# Patient Record
Sex: Male | Born: 1950 | Race: Black or African American | Hispanic: No | Marital: Married | Smoking: Never smoker
Health system: Southern US, Community
[De-identification: ages and names within clinical notes are randomized; demographics above are authoritative.]

## PROBLEM LIST (undated history)

## (undated) DIAGNOSIS — K4090 Unilateral inguinal hernia, without obstruction or gangrene, not specified as recurrent: Secondary | ICD-10-CM

---
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
## 2007-09-14 ENCOUNTER — Emergency Department (HOSPITAL_COMMUNITY): Admission: EM | Admit: 2007-09-14 | Discharge: 2007-09-14 | Payer: Self-pay | Admitting: Family Medicine

## 2013-04-14 ENCOUNTER — Ambulatory Visit: Payer: Self-pay

## 2018-10-18 ENCOUNTER — Emergency Department (HOSPITAL_COMMUNITY): Payer: No Typology Code available for payment source

## 2018-10-18 ENCOUNTER — Emergency Department (HOSPITAL_COMMUNITY)
Admission: EM | Admit: 2018-10-18 | Discharge: 2018-10-18 | Disposition: A | Discharge status: Home / Self Care | Payer: No Typology Code available for payment source | Attending: Emergency Medicine | Admitting: Emergency Medicine

## 2018-10-18 ENCOUNTER — Encounter (HOSPITAL_COMMUNITY): Payer: Self-pay | Admitting: Emergency Medicine

## 2018-10-18 ENCOUNTER — Other Ambulatory Visit: Payer: Self-pay

## 2018-10-18 DIAGNOSIS — M7918 Myalgia, other site: Secondary | ICD-10-CM | POA: Diagnosis not present

## 2018-10-18 DIAGNOSIS — R911 Solitary pulmonary nodule: Secondary | ICD-10-CM | POA: Diagnosis not present

## 2018-10-18 DIAGNOSIS — K4020 Bilateral inguinal hernia, without obstruction or gangrene, not specified as recurrent: Secondary | ICD-10-CM

## 2018-10-18 DIAGNOSIS — M542 Cervicalgia: Secondary | ICD-10-CM | POA: Insufficient documentation

## 2018-10-18 DIAGNOSIS — R1030 Lower abdominal pain, unspecified: Secondary | ICD-10-CM | POA: Diagnosis present

## 2018-10-18 LAB — CBC WITH DIFFERENTIAL/PLATELET
Abs Immature Granulocytes: 0.01 10*3/uL (ref 0.00–0.07)
Basophils Absolute: 0.1 10*3/uL (ref 0.0–0.1)
Basophils Relative: 1 %
Eosinophils Absolute: 0.1 10*3/uL (ref 0.0–0.5)
Eosinophils Relative: 3 %
HCT: 48 % (ref 39.0–52.0)
Hemoglobin: 15.9 g/dL (ref 13.0–17.0)
Immature Granulocytes: 0 %
Lymphocytes Relative: 35 %
Lymphs Abs: 1.4 10*3/uL (ref 0.7–4.0)
MCH: 31 pg (ref 26.0–34.0)
MCHC: 33.1 g/dL (ref 30.0–36.0)
MCV: 93.6 fL (ref 80.0–100.0)
Monocytes Absolute: 0.5 10*3/uL (ref 0.1–1.0)
Monocytes Relative: 14 %
Neutro Abs: 1.8 10*3/uL (ref 1.7–7.7)
Neutrophils Relative %: 47 %
Platelets: 218 10*3/uL (ref 150–400)
RBC: 5.13 MIL/uL (ref 4.22–5.81)
RDW: 13.4 % (ref 11.5–15.5)
WBC: 3.8 10*3/uL — ABNORMAL LOW (ref 4.0–10.5)
nRBC: 0 % (ref 0.0–0.2)

## 2018-10-18 LAB — URINALYSIS, ROUTINE W REFLEX MICROSCOPIC
Bilirubin Urine: NEGATIVE
Glucose, UA: NEGATIVE mg/dL
Hgb urine dipstick: NEGATIVE
Ketones, ur: 5 mg/dL — AB
Leukocytes,Ua: NEGATIVE
Nitrite: NEGATIVE
Protein, ur: NEGATIVE mg/dL
Specific Gravity, Urine: 1.025 (ref 1.005–1.030)
pH: 6 (ref 5.0–8.0)

## 2018-10-18 LAB — BASIC METABOLIC PANEL
Anion gap: 8 (ref 5–15)
BUN: 12 mg/dL (ref 8–23)
CO2: 25 mmol/L (ref 22–32)
Calcium: 9.2 mg/dL (ref 8.9–10.3)
Chloride: 107 mmol/L (ref 98–111)
Creatinine, Ser: 1.29 mg/dL — ABNORMAL HIGH (ref 0.61–1.24)
GFR calc Af Amer: >60 mL/min (ref >60)
GFR calc non Af Amer: 57 mL/min — ABNORMAL LOW (ref >60)
Glucose, Bld: 92 mg/dL (ref 70–99)
Potassium: 3.8 mmol/L (ref 3.5–5.1)
Sodium: 140 mmol/L (ref 135–145)

## 2018-10-18 MED ORDER — IBUPROFEN 400 MG PO TABS
600.0000 mg | ORAL_TABLET | Freq: Once | ORAL | Status: AC
  Administered 2018-10-18: 600 mg via ORAL
  Filled 2018-10-18: qty 1

## 2018-10-18 MED ORDER — METHOCARBAMOL 500 MG PO TABS: 500.0000 mg | ORAL_TABLET | Freq: Two times a day (BID) | ORAL | 0 refills | Status: DC | PRN

## 2018-10-18 MED ORDER — IOHEXOL 300 MG/ML  SOLN
100.0000 mL | Freq: Once | INTRAMUSCULAR | Status: AC | PRN
  Administered 2018-10-18: 100 mL via INTRAVENOUS

## 2018-10-18 NOTE — Discharge Instructions (Addendum)
Please read instructions below. Apply ice to your areas of pain for 20 minutes at a time. You can take 600 mg of Advil/ibuprofen every 6 hours as needed for pain. You can take flexeril every 12 hours as needed for muscle spasm. Be aware this medication can make you drowsy. Schedule an appointment with your primary care provider to follow up on your visit today. Return to the ER for severely worsening headache, vision changes, if new numbness or tingling in your arms or legs, inability to urinate, inability to hold your bowels, or weakness in your extremities.
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
## 2018-10-18 NOTE — ED Notes (Signed)
Pt speaking with wife, and care explained to wife, and pt regarding care.
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
## 2018-10-18 NOTE — ED Notes (Signed)
Message given to pts wife  That he was waiting on lab results
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
## 2018-10-18 NOTE — ED Notes (Signed)
Feeling better.

## 2018-11-12 ENCOUNTER — Other Ambulatory Visit: Payer: Self-pay | Admitting: General Surgery

## 2018-11-22 ENCOUNTER — Other Ambulatory Visit: Payer: Self-pay | Admitting: General Surgery

## 2018-11-23 NOTE — Patient Instructions (Addendum)
YOU NEED TO HAVE A COVID 19 TEST ON   Friday 8/28_______ @_8 :00______, THIS TEST MUST BE DONE BEFORE SURGERY, COME  [place], [place] , [id number]. ONCE YOUR COVID TEST IS COMPLETED, PLEASE BEGIN THE QUARANTINE INSTRUCTIONS AS OUTLINED IN YOUR HANDOUT.                Ralph Franklin    Your procedure is scheduled on: Tuesday 12/01/18   Report to [facility] Main  Entrance  Report to admitting at 7:30 AM   1 VISITOR IS ALLOWED TO WAIT IN WAITING ROOM  ONLY DAY OF YOUR SURGERY.  NO VISITORS ARE ALLOWED IN SHORT STAY OR RECOVERY ROOM.   Call this number if you have problems the morning of surgery [phone]    Remember: Do not eat food or drink liquids :After Midnight . BRUSH YOUR TEETH MORNING OF SURGERY AND RINSE YOUR MOUTH OUT, NO CHEWING GUM CANDY OR MINTS.     Take these medicines the morning of surgery with A SIP OF WATER: none                                 You may not have any metal on your body including piercings               Do not wear jewelry,  lotions, powders or deodorant                         Men may shave face and neck.   Do not bring valuables to the hospital. [place].  Contacts, dentures or bridgework may not be worn into surgery.       Patients discharged the day of surgery will not be allowed to drive home.  IF YOU ARE HAVING SURGERY AND GOING HOME THE SAME DAY, YOU MUST HAVE AN ADULT TO DRIVE YOU HOME AND BE WITH YOU FOR 24 HOURS.  YOU MAY GO HOME BY TAXI OR UBER OR ORTHERWISE, BUT AN ADULT MUST ACCOMPANY YOU HOME AND STAY WITH YOU FOR 24 HOURS.  Name and phone number of your driver:  Special Instructions: N/A                           [place] - Preparing for Surgery  Before surgery, you can play an important role.   Because skin is not sterile, your skin needs to be as free of germs as possible.   You can reduce the number of germs on your skin by washing with CHG  (chlorahexidine gluconate) soap before surgery.   CHG is an antiseptic cleaner which kills germs and bonds with the skin to continue killing germs even after washing. Please DO NOT use if you have an allergy to CHG or antibacterial soaps.   If your skin becomes reddened/irritated stop using the CHG and inform your nurse when you arrive at Short Stay. You may shave your face/neck. Please follow these instructions carefully:  1.  Shower with CHG Soap the night before surgery and the  morning of Surgery.  2.  If you choose to wash your hair, wash your hair first as usual with your  normal  shampoo.  3.  After you shampoo, rinse your hair and body  thoroughly to remove the  shampoo.                                         4.  Use CHG as you would any other liquid soap.  You can apply chg directly  to the skin and wash                       Gently with a scrungie or clean washcloth.  5.  Apply the CHG Soap to your body ONLY FROM THE NECK DOWN.   Do not use on face/ open                           Wound or open sores. Avoid contact with eyes, ears mouth and genitals (private parts).                       Wash face,  Genitals (private parts) with your normal soap.             6.  Wash thoroughly, paying special attention to the area where your surgery  will be performed.  7.  Thoroughly rinse your body with warm water from the neck down.  8.  DO NOT shower/wash with your normal soap after using and rinsing off  the CHG Soap.             9.  Pat yourself dry with a clean towel.            10.  Wear clean pajamas.            11.  Place clean sheets on your bed the night of your first shower and do not  sleep with pets. Day of Surgery : Do not apply any lotions/deodorants the morning of surgery.  Please wear clean clothes to the hospital/surgery center.  FAILURE TO FOLLOW THESE INSTRUCTIONS MAY RESULT IN THE CANCELLATION OF YOUR SURGERY PATIENT SIGNATURE_________________________________  NURSE  SIGNATURE__________________________________  ________________________________________________________________________

## 2018-11-24 ENCOUNTER — Encounter (HOSPITAL_COMMUNITY)
Admission: RE | Admit: 2018-11-24 | Discharge: 2018-11-24 | Disposition: A | Discharge status: Home / Self Care | Payer: No Typology Code available for payment source | Source: Ambulatory Visit | Attending: General Surgery | Admitting: General Surgery

## 2018-11-24 ENCOUNTER — Other Ambulatory Visit: Payer: Self-pay

## 2018-11-24 ENCOUNTER — Encounter (HOSPITAL_COMMUNITY): Payer: Self-pay

## 2018-11-24 DIAGNOSIS — K4020 Bilateral inguinal hernia, without obstruction or gangrene, not specified as recurrent: Secondary | ICD-10-CM | POA: Diagnosis not present

## 2018-11-24 DIAGNOSIS — R9431 Abnormal electrocardiogram [ECG] [EKG]: Secondary | ICD-10-CM | POA: Diagnosis not present

## 2018-11-24 DIAGNOSIS — Z01818 Encounter for other preprocedural examination: Secondary | ICD-10-CM | POA: Insufficient documentation

## 2018-11-24 LAB — COMPREHENSIVE METABOLIC PANEL
ALT: 15 U/L (ref 0–44)
AST: 13 U/L — ABNORMAL LOW (ref 15–41)
Albumin: 4.1 g/dL (ref 3.5–5.0)
Alkaline Phosphatase: 46 U/L (ref 38–126)
Anion gap: 7 (ref 5–15)
BUN: 15 mg/dL (ref 8–23)
CO2: 29 mmol/L (ref 22–32)
Calcium: 9 mg/dL (ref 8.9–10.3)
Chloride: 105 mmol/L (ref 98–111)
Creatinine, Ser: 1.15 mg/dL (ref 0.61–1.24)
GFR calc Af Amer: >60 mL/min (ref >60)
GFR calc non Af Amer: >60 mL/min (ref >60)
Glucose, Bld: 87 mg/dL (ref 70–99)
Potassium: 3.9 mmol/L (ref 3.5–5.1)
Sodium: 141 mmol/L (ref 135–145)
Total Bilirubin: 0.9 mg/dL (ref 0.3–1.2)
Total Protein: 6.9 g/dL (ref 6.5–8.1)

## 2018-11-24 LAB — CBC WITH DIFFERENTIAL/PLATELET
Abs Immature Granulocytes: 0.01 10*3/uL (ref 0.00–0.07)
Basophils Absolute: 0 10*3/uL (ref 0.0–0.1)
Basophils Relative: 1 %
Eosinophils Absolute: 0.2 10*3/uL (ref 0.0–0.5)
Eosinophils Relative: 5 %
HCT: 47.3 % (ref 39.0–52.0)
Hemoglobin: 15.5 g/dL (ref 13.0–17.0)
Immature Granulocytes: 0 %
Lymphocytes Relative: 26 %
Lymphs Abs: 1 10*3/uL (ref 0.7–4.0)
MCH: 31.2 pg (ref 26.0–34.0)
MCHC: 32.8 g/dL (ref 30.0–36.0)
MCV: 95.2 fL (ref 80.0–100.0)
Monocytes Absolute: 0.6 10*3/uL (ref 0.1–1.0)
Monocytes Relative: 15 %
Neutro Abs: 2.1 10*3/uL (ref 1.7–7.7)
Neutrophils Relative %: 53 %
Platelets: 197 10*3/uL (ref 150–400)
RBC: 4.97 MIL/uL (ref 4.22–5.81)
RDW: 13.6 % (ref 11.5–15.5)
WBC: 4 10*3/uL (ref 4.0–10.5)
nRBC: 0 % (ref 0.0–0.2)

## 2018-11-24 NOTE — Progress Notes (Signed)
PCP - Dr. Milas Gain Cardiologist -   Chest x-ray -  EKG -on chart  Stress Test -  ECHO -  Cardiac Cath -   Sleep Study - no CPAP -   Fasting Blood Sugar - na Checks Blood Sugar _____ times a day  Blood Thinner Instructions:an Aspirin Instructions: Last Dose:  Anesthesia review:   Patient denies shortness of breath, fever, cough and chest pain at PAT appointment yes   Patient verbalized understanding of instructions that were given to them at the PAT appointment. Patient was also instructed that they will need to review over the PAT instructions again at home before surgery. yes

## 2018-11-27 ENCOUNTER — Other Ambulatory Visit (HOSPITAL_COMMUNITY)
Admission: RE | Admit: 2018-11-27 | Discharge: 2018-11-27 | Disposition: A | Discharge status: Home / Self Care | Payer: Medicare Other | Source: Ambulatory Visit | Attending: General Surgery | Admitting: General Surgery

## 2018-11-27 DIAGNOSIS — Z01812 Encounter for preprocedural laboratory examination: Secondary | ICD-10-CM | POA: Insufficient documentation

## 2018-11-27 DIAGNOSIS — Z20828 Contact with and (suspected) exposure to other viral communicable diseases: Secondary | ICD-10-CM | POA: Insufficient documentation

## 2018-11-27 LAB — SARS CORONAVIRUS 2 (TAT 6-24 HRS): SARS Coronavirus 2: NEGATIVE
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
## 2018-12-01 ENCOUNTER — Ambulatory Visit (HOSPITAL_COMMUNITY): Payer: No Typology Code available for payment source | Admitting: Physician Assistant

## 2018-12-01 ENCOUNTER — Encounter (HOSPITAL_COMMUNITY): Payer: Self-pay | Admitting: *Deleted

## 2018-12-01 ENCOUNTER — Ambulatory Visit (HOSPITAL_COMMUNITY): Payer: No Typology Code available for payment source | Admitting: Anesthesiology

## 2018-12-01 ENCOUNTER — Ambulatory Visit (HOSPITAL_COMMUNITY)
Admission: RE | Admit: 2018-12-01 | Discharge: 2018-12-01 | Disposition: A | Discharge status: Home / Self Care | Payer: No Typology Code available for payment source | Source: Ambulatory Visit | Attending: General Surgery | Admitting: General Surgery

## 2018-12-01 ENCOUNTER — Encounter (HOSPITAL_COMMUNITY)
Admission: RE | Disposition: A | Discharge status: Home / Self Care | Payer: Self-pay | Source: Ambulatory Visit | Attending: General Surgery

## 2018-12-01 DIAGNOSIS — K4090 Unilateral inguinal hernia, without obstruction or gangrene, not specified as recurrent: Secondary | ICD-10-CM

## 2018-12-01 DIAGNOSIS — K4020 Bilateral inguinal hernia, without obstruction or gangrene, not specified as recurrent: Secondary | ICD-10-CM | POA: Diagnosis not present

## 2018-12-01 DIAGNOSIS — D176 Benign lipomatous neoplasm of spermatic cord: Secondary | ICD-10-CM | POA: Diagnosis not present

## 2018-12-01 HISTORY — PX: INGUINAL HERNIA REPAIR: SHX194

## 2018-12-01 HISTORY — DX: Unilateral inguinal hernia, without obstruction or gangrene, not specified as recurrent: K40.90

## 2018-12-01 SURGERY — REPAIR, HERNIA, INGUINAL, BILATERAL, LAPAROSCOPIC
Anesthesia: General | Site: Abdomen | Laterality: Bilateral

## 2018-12-01 MED ORDER — EPHEDRINE SULFATE-NACL 50-0.9 MG/10ML-% IV SOSY
PREFILLED_SYRINGE | INTRAVENOUS | Status: DC | PRN
  Administered 2018-12-01: 5 mg via INTRAVENOUS

## 2018-12-01 MED ORDER — CHLORHEXIDINE GLUCONATE CLOTH 2 % EX PADS: 6.0000 | MEDICATED_PAD | Freq: Once | CUTANEOUS | Status: DC

## 2018-12-01 MED ORDER — DEXAMETHASONE SODIUM PHOSPHATE 10 MG/ML IJ SOLN
INTRAMUSCULAR | Status: DC | PRN
  Administered 2018-12-01: 10 mg via INTRAVENOUS

## 2018-12-01 MED ORDER — ONDANSETRON HCL 4 MG/2ML IJ SOLN
4.0000 mg | Freq: Once | INTRAMUSCULAR | Status: AC | PRN
  Administered 2018-12-01: 4 mg via INTRAVENOUS

## 2018-12-01 MED ORDER — OXYCODONE HCL 5 MG/5ML PO SOLN: 5.0000 mg | Freq: Once | ORAL | Status: DC | PRN

## 2018-12-01 MED ORDER — OXYCODONE HCL 5 MG PO TABS: 5.0000 mg | ORAL_TABLET | Freq: Once | ORAL | Status: DC | PRN

## 2018-12-01 MED ORDER — MIDAZOLAM HCL 5 MG/5ML IJ SOLN
INTRAMUSCULAR | Status: DC | PRN
  Administered 2018-12-01: 2 mg via INTRAVENOUS

## 2018-12-01 MED ORDER — PHENYLEPHRINE 40 MCG/ML (10ML) SYRINGE FOR IV PUSH (FOR BLOOD PRESSURE SUPPORT)
PREFILLED_SYRINGE | INTRAVENOUS | Status: AC
  Filled 2018-12-01: qty 10

## 2018-12-01 MED ORDER — CEFAZOLIN SODIUM-DEXTROSE 2-4 GM/100ML-% IV SOLN
2.0000 g | INTRAVENOUS | Status: AC
  Administered 2018-12-01: 10:00:00 2 g via INTRAVENOUS
  Filled 2018-12-01: qty 100

## 2018-12-01 MED ORDER — ONDANSETRON HCL 4 MG/2ML IJ SOLN
INTRAMUSCULAR | Status: DC | PRN
  Administered 2018-12-01: 4 mg via INTRAVENOUS

## 2018-12-01 MED ORDER — ROCURONIUM BROMIDE 10 MG/ML (PF) SYRINGE
PREFILLED_SYRINGE | INTRAVENOUS | Status: AC
  Filled 2018-12-01: qty 10

## 2018-12-01 MED ORDER — DEXAMETHASONE SODIUM PHOSPHATE 10 MG/ML IJ SOLN
INTRAMUSCULAR | Status: AC
  Filled 2018-12-01: qty 1

## 2018-12-01 MED ORDER — ACETAMINOPHEN 500 MG PO TABS
1000.0000 mg | ORAL_TABLET | ORAL | Status: AC
  Administered 2018-12-01: 1000 mg via ORAL
  Filled 2018-12-01: qty 2

## 2018-12-01 MED ORDER — CELECOXIB 200 MG PO CAPS
200.0000 mg | ORAL_CAPSULE | ORAL | Status: AC
  Administered 2018-12-01: 200 mg via ORAL
  Filled 2018-12-01: qty 1

## 2018-12-01 MED ORDER — HYDROCODONE-ACETAMINOPHEN 5-325 MG PO TABS: 1.0000 | ORAL_TABLET | Freq: Four times a day (QID) | ORAL | 0 refills | Status: DC | PRN

## 2018-12-01 MED ORDER — SUGAMMADEX SODIUM 200 MG/2ML IV SOLN
INTRAVENOUS | Status: DC | PRN
  Administered 2018-12-01: 200 mg via INTRAVENOUS

## 2018-12-01 MED ORDER — FENTANYL CITRATE (PF) 100 MCG/2ML IJ SOLN
25.0000 ug | INTRAMUSCULAR | Status: DC | PRN
  Administered 2018-12-01 (×2): 50 ug via INTRAVENOUS

## 2018-12-01 MED ORDER — GABAPENTIN 300 MG PO CAPS
300.0000 mg | ORAL_CAPSULE | ORAL | Status: AC
  Administered 2018-12-01: 08:00:00 300 mg via ORAL
  Filled 2018-12-01: qty 1

## 2018-12-01 MED ORDER — MIDAZOLAM HCL 2 MG/2ML IJ SOLN
INTRAMUSCULAR | Status: AC
  Filled 2018-12-01: qty 2

## 2018-12-01 MED ORDER — LIDOCAINE 2% (20 MG/ML) 5 ML SYRINGE
INTRAMUSCULAR | Status: DC | PRN
  Administered 2018-12-01: 80 mg via INTRAVENOUS

## 2018-12-01 MED ORDER — FENTANYL CITRATE (PF) 250 MCG/5ML IJ SOLN
INTRAMUSCULAR | Status: AC
  Filled 2018-12-01: qty 5

## 2018-12-01 MED ORDER — FENTANYL CITRATE (PF) 100 MCG/2ML IJ SOLN
INTRAMUSCULAR | Status: DC | PRN
  Administered 2018-12-01: 100 ug via INTRAVENOUS
  Administered 2018-12-01: 50 ug via INTRAVENOUS

## 2018-12-01 MED ORDER — BUPIVACAINE-EPINEPHRINE 0.5% -1:200000 IJ SOLN
INTRAMUSCULAR | Status: AC
  Filled 2018-12-01: qty 1

## 2018-12-01 MED ORDER — EPHEDRINE 5 MG/ML INJ
INTRAVENOUS | Status: AC
  Filled 2018-12-01: qty 10

## 2018-12-01 MED ORDER — LACTATED RINGERS IV SOLN
INTRAVENOUS | Status: DC
  Administered 2018-12-01 (×2): via INTRAVENOUS

## 2018-12-01 MED ORDER — 0.9 % SODIUM CHLORIDE (POUR BTL) OPTIME
TOPICAL | Status: DC | PRN
  Administered 2018-12-01: 10:00:00 1000 mL

## 2018-12-01 MED ORDER — SODIUM CHLORIDE 0.9% FLUSH: 3.0000 mL | Freq: Two times a day (BID) | INTRAVENOUS | Status: DC

## 2018-12-01 MED ORDER — BUPIVACAINE-EPINEPHRINE 0.5% -1:200000 IJ SOLN
INTRAMUSCULAR | Status: DC | PRN
  Administered 2018-12-01: 20 mL

## 2018-12-01 MED ORDER — PHENYLEPHRINE 40 MCG/ML (10ML) SYRINGE FOR IV PUSH (FOR BLOOD PRESSURE SUPPORT)
PREFILLED_SYRINGE | INTRAVENOUS | Status: DC | PRN
  Administered 2018-12-01: 40 ug via INTRAVENOUS
  Administered 2018-12-01: 120 ug via INTRAVENOUS

## 2018-12-01 MED ORDER — FENTANYL CITRATE (PF) 100 MCG/2ML IJ SOLN
INTRAMUSCULAR | Status: AC
  Filled 2018-12-01: qty 2

## 2018-12-01 MED ORDER — ONDANSETRON HCL 4 MG/2ML IJ SOLN
INTRAMUSCULAR | Status: AC
  Filled 2018-12-01: qty 2

## 2018-12-01 MED ORDER — ROCURONIUM BROMIDE 50 MG/5ML IV SOSY
PREFILLED_SYRINGE | INTRAVENOUS | Status: DC | PRN
  Administered 2018-12-01: 10 mg via INTRAVENOUS
  Administered 2018-12-01: 50 mg via INTRAVENOUS
  Administered 2018-12-01: 10 mg via INTRAVENOUS

## 2018-12-01 MED ORDER — LIDOCAINE 2% (20 MG/ML) 5 ML SYRINGE
INTRAMUSCULAR | Status: AC
  Filled 2018-12-01: qty 5

## 2018-12-01 MED ORDER — PROPOFOL 10 MG/ML IV BOLUS
INTRAVENOUS | Status: DC | PRN
  Administered 2018-12-01: 180 mg via INTRAVENOUS

## 2018-12-01 MED ORDER — SUCCINYLCHOLINE CHLORIDE 200 MG/10ML IV SOSY
PREFILLED_SYRINGE | INTRAVENOUS | Status: AC
  Filled 2018-12-01: qty 10

## 2018-12-01 SURGICAL SUPPLY — 39 items
APPLIER CLIP 5 13 M/L LIGAMAX5 (MISCELLANEOUS)
BENZOIN TINCTURE PRP APPL 2/3 (GAUZE/BANDAGES/DRESSINGS) IMPLANT
CABLE HIGH FREQUENCY MONO STRZ (ELECTRODE) ×3 IMPLANT
CHLORAPREP W/TINT 26 (MISCELLANEOUS) ×3 IMPLANT
CLIP APPLIE 5 13 M/L LIGAMAX5 (MISCELLANEOUS) IMPLANT
CLOSURE WOUND 1/2 X4 (GAUZE/BANDAGES/DRESSINGS)
COVER SURGICAL LIGHT HANDLE (MISCELLANEOUS) ×3 IMPLANT
COVER WAND RF STERILE (DRAPES) IMPLANT
DECANTER SPIKE VIAL GLASS SM (MISCELLANEOUS) ×3 IMPLANT
DERMABOND ADVANCED (GAUZE/BANDAGES/DRESSINGS)
DERMABOND ADVANCED .7 DNX12 (GAUZE/BANDAGES/DRESSINGS) IMPLANT
DEVICE SECURE STRAP 25 ABSORB (INSTRUMENTS) ×3 IMPLANT
DISSECT BALLN SPACEMKR + OVL (BALLOONS) ×3
DISSECTOR BALLN SPACEMKR + OVL (BALLOONS) ×1 IMPLANT
DISSECTOR BLUNT TIP ENDO 5MM (MISCELLANEOUS) IMPLANT
DRAPE WARM FLUID 44X44 (DRAPES) IMPLANT
ELECT REM PT RETURN 15FT ADLT (MISCELLANEOUS) ×3 IMPLANT
GLOVE EUDERMIC 7 POWDERFREE (GLOVE) ×3 IMPLANT
GOWN STRL REUS W/TWL XL LVL3 (GOWN DISPOSABLE) ×9 IMPLANT
HEMOSTAT SNOW SURGICEL 2X4 (HEMOSTASIS) IMPLANT
KIT BASIN OR (CUSTOM PROCEDURE TRAY) ×3 IMPLANT
KIT TURNOVER KIT A (KITS) IMPLANT
MESH 3DMAX LIGHT 4.1X6.2 LT LR (Mesh General) ×3 IMPLANT
MESH 3DMAX LIGHT 4.1X6.2 RT LR (Mesh General) ×3 IMPLANT
PAD POSITIONING PINK XL (MISCELLANEOUS) ×3 IMPLANT
PROTECTOR NERVE ULNAR (MISCELLANEOUS) IMPLANT
SCISSORS LAP 5X35 DISP (ENDOMECHANICALS) ×3 IMPLANT
SET IRRIG TUBING LAPAROSCOPIC (IRRIGATION / IRRIGATOR) IMPLANT
SET TUBE SMOKE EVAC HIGH FLOW (TUBING) ×3 IMPLANT
STOPCOCK 4 WAY LG BORE MALE ST (IV SETS) IMPLANT
STRIP CLOSURE SKIN 1/2X4 (GAUZE/BANDAGES/DRESSINGS) IMPLANT
SUT MNCRL AB 4-0 PS2 18 (SUTURE) ×3 IMPLANT
SUT VIC AB 3-0 SH 27 (SUTURE)
SUT VIC AB 3-0 SH 27XBRD (SUTURE) IMPLANT
TACKER 5MM HERNIA 3.5CML NAB (ENDOMECHANICALS) ×3 IMPLANT
TAPE CLOTH 4X10 WHT NS (GAUZE/BANDAGES/DRESSINGS) IMPLANT
TOWEL OR 17X26 10 PK STRL BLUE (TOWEL DISPOSABLE) ×3 IMPLANT
TRAY LAPAROSCOPIC (CUSTOM PROCEDURE TRAY) ×3 IMPLANT
TROCAR CANNULA W/PORT DUAL 5MM (MISCELLANEOUS) ×6 IMPLANT

## 2018-12-01 NOTE — Transfer of Care (Signed)
Immediate Anesthesia Transfer of Care Note  Patient: Ralph Franklin  Procedure(s) Performed: LAPAROSCOPIC BILATERAL INGUINAL HERNIA REPAIR WITH MESH (Bilateral Abdomen)  Patient Location: PACU  Anesthesia Type:General  Level of Consciousness: awake, alert  and oriented  Airway & Oxygen Therapy: Patient Spontanous Breathing and Patient connected to face mask oxygen  Post-op Assessment: Report given to RN and Post -op Vital signs reviewed and stable  Post vital signs: Reviewed and stable  Last Vitals:  Vitals Value Taken Time  BP 141/98 12/01/18 1141  Temp    Pulse 76 12/01/18 1143  Resp 15 12/01/18 1143  SpO2 100 % 12/01/18 1143  Vitals shown include unvalidated device data.  Last Pain:  Vitals:   12/01/18 0800  TempSrc:   PainSc: 0-No pain         Complications: No apparent anesthesia complications

## 2018-12-01 NOTE — Anesthesia Preprocedure Evaluation (Addendum)
Anesthesia Evaluation  Patient identified by MRN, date of birth, ID band Patient awake    Reviewed: Allergy & Precautions, NPO status , Patient's Chart, lab work & pertinent test results  Airway Mallampati: II  TM Distance: >3 FB Neck ROM: Full    Dental  (+) Teeth Intact, Dental Advisory Given   Pulmonary    breath sounds clear to auscultation       Cardiovascular  Rhythm:Regular Rate:Normal     Neuro/Psych    GI/Hepatic   Endo/Other    Renal/GU      Musculoskeletal   Abdominal   Peds  Hematology   Anesthesia Other Findings   Reproductive/Obstetrics                            Anesthesia Physical Anesthesia Plan  ASA: II  Anesthesia Plan: General   Post-op Pain Management:    Induction: Intravenous  PONV Risk Score and Plan: Ondansetron and Dexamethasone  Airway Management Planned: Oral ETT  Additional Equipment:   Intra-op Plan:   Post-operative Plan: Extubation in OR  Informed Consent: I have reviewed the patients History and Physical, chart, labs and discussed the procedure including the risks, benefits and alternatives for the proposed anesthesia with the patient or authorized representative who has indicated his/her understanding and acceptance.       Plan Discussed with: Anesthesiologist and CRNA  Anesthesia Plan Comments:         Anesthesia Quick Evaluation
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
## 2018-12-01 NOTE — Discharge Instructions (Signed)
[facility] _______[facility]  UMBILICAL OR INGUINAL HERNIA REPAIR: POST OP INSTRUCTIONS  Always review your discharge instruction sheet given to you by the facility where your surgery was performed. IF YOU HAVE DISABILITY OR FAMILY LEAVE FORMS, YOU MUST BRING THEM TO THE OFFICE FOR PROCESSING.   DO NOT GIVE THEM TO YOUR DOCTOR.  1. A  prescription for pain medication may be given to you upon discharge.  Take your pain medication as prescribed, if needed.  If narcotic pain medicine is not needed, then you may take acetaminophen (Tylenol) or ibuprofen (Advil) as needed. 2. Take your usually prescribed medications unless otherwise directed. If you need a refill on your pain medication, please contact your pharmacy.  They will contact our office to request authorization. Prescriptions will not be filled after 5 pm or on week-ends. 3. You should follow a light diet the first 24 hours after arrival home, such as soup and crackers, etc.  Be sure to include lots of fluids daily.  Resume your normal diet the day after surgery. 4.Most patients will experience some swelling and bruising around the umbilicus or in the groin and scrotum.  Ice packs and reclining will help.  Swelling and bruising can take several days to resolve.  6. It is common to experience some constipation if taking pain medication after surgery.  Increasing fluid intake and taking a stool softener (such as Colace) will usually help or prevent this problem from occurring.  A mild laxative (Milk of Magnesia or Miralax) should be taken according to package directions if there are no bowel movements after 48 hours. 7. Unless discharge instructions indicate otherwise, you may remove your bandages 24-48 hours after surgery, and you may shower at that time.  You may have steri-strips (small skin tapes) in place directly over the incision.  These strips should be left on the skin for 7-10 days.  If your surgeon used skin glue on the  incision, you may shower in 24 hours.  The glue will flake off over the next 2-3 weeks.  Any sutures or staples will be removed at the office during your follow-up visit. 8. ACTIVITIES:  You may resume regular (light) daily activities beginning the next day--such as daily self-care, walking, climbing stairs--gradually increasing activities as tolerated.  You may have sexual intercourse when it is comfortable.  Refrain from any heavy lifting or straining until approved by your doctor.  a.You may drive when you are no longer taking prescription pain medication, you can comfortably wear a seatbelt, and you can safely maneuver your car and apply brakes. b.RETURN TO WORK:   _____________________________________________  9.You should see your doctor in the office for a follow-up appointment approximately 2-3 weeks after your surgery.  Make sure that you call for this appointment within a day or two after you arrive home to insure a convenient appointment time. 10.OTHER INSTRUCTIONS: _________________________    _____________________________________  WHEN TO CALL YOUR DOCTOR: 1. Fever over 101.0 2. Inability to urinate 3. Nausea and/or vomiting 4. Extreme swelling or bruising 5. Continued bleeding from incision. 6. Increased pain, redness, or drainage from the incision  The clinic staff is available to answer your questions during regular business hours.  Please don't hesitate to call and ask to speak to one of the nurses for clinical concerns.  If you have a medical emergency, go to the nearest emergency room or call 911.  A surgeon from [facility] is always on call at the hospital     [address], [address], [place], [place]  [address] ?  [address] [address], [place],    [address] [phone] ? [phone] ? FAX [phone] Web site: [email or web address]  .........   Managing Your Pain After Surgery Without Opioids    Thank you for participating in our  program to help patients manage their pain after surgery without opioids. This is part of our effort to provide you with the best care possible, without exposing you or your family to the risk that opioids pose.  What pain can I expect after surgery? You can expect to have some pain after surgery. This is normal. The pain is typically worse the day after surgery, and quickly begins to get better. Many studies have found that many patients are able to manage their pain after surgery with Over-the-Counter (OTC) medications such as Tylenol and Motrin. If you have a condition that does not allow you to take Tylenol or Motrin, notify your surgical team.  How will I manage my pain? The best strategy for controlling your pain after surgery is around the clock pain control with Tylenol (acetaminophen) and Motrin (ibuprofen or Advil). Alternating these medications with each other allows you to maximize your pain control. In addition to Tylenol and Motrin, you can use heating pads or ice packs on your incisions to help reduce your pain.  How will I alternate your regular strength over-the-counter pain medication? You will take a dose of pain medication every three hours. ; Start by taking 650 mg of Tylenol (2 pills of 325 mg) ; 3 hours later take 600 mg of Motrin (3 pills of 200 mg) ; 3 hours after taking the Motrin take 650 mg of Tylenol ; 3 hours after that take 600 mg of Motrin.   - 1 -  See example - if your first dose of Tylenol is at 12:00 PM   12:00 PM Tylenol 650 mg (2 pills of 325 mg)  3:00 PM Motrin 600 mg (3 pills of 200 mg)  6:00 PM Tylenol 650 mg (2 pills of 325 mg)  9:00 PM Motrin 600 mg (3 pills of 200 mg)  Continue alternating every 3 hours   We recommend that you follow this schedule around-the-clock for at least 3 days after surgery, or until you feel that it is no longer needed. Use the table on the last page of this handout to keep track of the medications you are  taking. Important: Do not take more than 3000mg of Tylenol or 3200mg of Motrin in a 24-hour period. Do not take ibuprofen/Motrin if you have a history of bleeding stomach ulcers, severe kidney disease, &/or actively taking a blood thinner  What if I still have pain? If you have pain that is not controlled with the over-the-counter pain medications (Tylenol and Motrin or Advil) you might have what we call "breakthrough" pain. You will receive a prescription for a small amount of an opioid pain medication such as Oxycodone, Tramadol, or Tylenol with Codeine. Use these opioid pills in the first 24 hours after surgery if you have breakthrough pain. Do not take more than 1 pill every 4-6 hours.  If you still have uncontrolled pain after using all opioid pills, don't hesitate to call our staff using the number provided. We will help make sure you are managing your pain in the best way possible, and if necessary, we can provide a prescription for additional pain medication.   Day 1    Time  Name of   Medication Number of pills taken  Amount of Acetaminophen  Pain Level   Comments  AM PM       AM PM       AM PM       AM PM       AM PM       AM PM       AM PM       AM PM       Total Daily amount of Acetaminophen Do not take more than  3,000 mg per day      Day 2    Time  Name of Medication Number of pills taken  Amount of Acetaminophen  Pain Level   Comments  AM PM       AM PM       AM PM       AM PM       AM PM       AM PM       AM PM       AM PM       Total Daily amount of Acetaminophen Do not take more than  3,000 mg per day      Day 3    Time  Name of Medication Number of pills taken  Amount of Acetaminophen  Pain Level   Comments  AM PM       AM PM       AM PM       AM PM          AM PM       AM PM       AM PM       AM PM       Total Daily amount of Acetaminophen Do not take more than  3,000 mg per day      Day 4    Time  Name of Medication  Number of pills taken  Amount of Acetaminophen  Pain Level   Comments  AM PM       AM PM       AM PM       AM PM       AM PM       AM PM       AM PM       AM PM       Total Daily amount of Acetaminophen Do not take more than  3,000 mg per day      Day 5    Time  Name of Medication Number of pills taken  Amount of Acetaminophen  Pain Level   Comments  AM PM       AM PM       AM PM       AM PM       AM PM       AM PM       AM PM       AM PM       Total Daily amount of Acetaminophen Do not take more than  3,000 mg per day       Day 6    Time  Name of Medication Number of pills taken  Amount of Acetaminophen  Pain Level  Comments  AM PM       AM PM       AM PM       AM PM       AM PM         AM PM       AM PM       AM PM       Total Daily amount of Acetaminophen Do not take more than  3,000 mg per day      Day 7    Time  Name of Medication Number of pills taken  Amount of Acetaminophen  Pain Level   Comments  AM PM       AM PM       AM PM       AM PM       AM PM       AM PM       AM PM       AM PM       Total Daily amount of Acetaminophen Do not take more than  3,000 mg per day        For additional information about how and where to safely dispose of unused opioid medications - https://www.morepowerfulnc.org  Disclaimer: This document contains information and/or instructional materials adapted from [facility] for the typical patient with your condition. It does not replace medical advice from your health care provider because your experience may differ from that of the typical patient. Talk to your health care provider if you have any questions about this document, your condition or your treatment plan. Adapted from [facility]   

## 2018-12-01 NOTE — Anesthesia Procedure Notes (Signed)
Procedure Name: Intubation Date/Time: 12/01/2018 9:55 AM Performed by: Maxwell Caul, CRNA Pre-anesthesia Checklist: Patient identified, Emergency Drugs available, Suction available and Patient being monitored Patient Re-evaluated:Patient Re-evaluated prior to induction Oxygen Delivery Method: Circle system utilized Preoxygenation: Pre-oxygenation with 100% oxygen Induction Type: IV induction Ventilation: Mask ventilation without difficulty Laryngoscope Size: Mac and 4 Grade View: Grade I Tube type: Oral Tube size: 7.5 mm Number of attempts: 1 Airway Equipment and Method: Stylet Placement Confirmation: ETT inserted through vocal cords under direct vision,  positive ETCO2 and breath sounds checked- equal and bilateral Secured at: 21 cm Tube secured with: Tape Dental Injury: Teeth and Oropharynx as per pre-operative assessment  Comments: Intubated by EMT.

## 2018-12-01 NOTE — Interval H&P Note (Signed)
History and Physical Interval Note:  12/01/2018 7:28 AM  Ralph Franklin  has presented today for surgery, with the diagnosis of [place].  The various methods of treatment have been discussed with the patient and family. After consideration of risks, benefits and other options for treatment, the patient has consented to  Procedure(s): LAPAROSCOPIC BILATERAL INGUINAL HERNIA REPAIR WITH MESH (Bilateral) as a surgical intervention.  The patient's history has been reviewed, patient examined, no change in status, stable for surgery.  I have reviewed the patient's chart and labs.  Questions were answered to the patient's satisfaction.     Adin Hector

## 2018-12-01 NOTE — Anesthesia Postprocedure Evaluation (Signed)
Anesthesia Post Note  Patient: Ralph Franklin  Procedure(s) Performed: LAPAROSCOPIC BILATERAL INGUINAL HERNIA REPAIR WITH MESH (Bilateral Abdomen)     Patient location during evaluation: PACU Anesthesia Type: General Level of consciousness: awake and alert Pain management: pain level controlled Vital Signs Assessment: post-procedure vital signs reviewed and stable Respiratory status: spontaneous breathing, nonlabored ventilation, respiratory function stable and patient connected to nasal cannula oxygen Cardiovascular status: blood pressure returned to baseline and stable Postop Assessment: no apparent nausea or vomiting Anesthetic complications: no    Last Vitals:  Vitals:   12/01/18 1230 12/01/18 1409  BP: (!) 143/95 (!) 147/96  Pulse: (!) 56 63  Resp: (!) 9 18  Temp:    SpO2: 100% 97%    Last Pain:  Vitals:   12/01/18 1409  TempSrc:   PainSc: 4                  [address] COKER

## 2018-12-03 ENCOUNTER — Encounter (HOSPITAL_COMMUNITY): Payer: Self-pay | Admitting: General Surgery

## 2019-01-20 ENCOUNTER — Other Ambulatory Visit: Payer: Self-pay | Admitting: Neurosurgery

## 2019-01-21 ENCOUNTER — Other Ambulatory Visit: Payer: Self-pay

## 2019-01-21 ENCOUNTER — Other Ambulatory Visit (HOSPITAL_COMMUNITY)
Admission: RE | Admit: 2019-01-21 | Discharge: 2019-01-21 | Disposition: A | Discharge status: Home / Self Care | Payer: Medicare Other | Source: Ambulatory Visit | Attending: Neurosurgery | Admitting: Neurosurgery

## 2019-01-21 DIAGNOSIS — Z01812 Encounter for preprocedural laboratory examination: Secondary | ICD-10-CM | POA: Insufficient documentation

## 2019-01-21 DIAGNOSIS — Z20828 Contact with and (suspected) exposure to other viral communicable diseases: Secondary | ICD-10-CM | POA: Diagnosis not present

## 2019-01-21 NOTE — Progress Notes (Signed)
Spoke with pt for pre-op call. Pt states his name is Ralph Franklin, Ralph Franklin. We have the right birthdate, but the wrong name. The name was correct earlier today, but at some point it was changed.  Pt denies cardiac history, HTN or Diabetes.  Pt had his Covid test done today. He states he is in quarantine now and will continue until day of surgery.  Pt voiced understanding of Visitation policy.

## 2019-01-23 LAB — NOVEL CORONAVIRUS, NAA (HOSP ORDER, SEND-OUT TO REF LAB; TAT 18-24 HRS): SARS-CoV-2, NAA: NOT DETECTED

## 2019-01-25 ENCOUNTER — Inpatient Hospital Stay (HOSPITAL_COMMUNITY): Payer: No Typology Code available for payment source | Admitting: Certified Registered Nurse Anesthetist

## 2019-01-25 ENCOUNTER — Inpatient Hospital Stay (HOSPITAL_COMMUNITY): Payer: No Typology Code available for payment source

## 2019-01-25 ENCOUNTER — Encounter (HOSPITAL_COMMUNITY): Payer: Self-pay

## 2019-01-25 ENCOUNTER — Encounter (HOSPITAL_COMMUNITY)
Admission: RE | Disposition: A | Discharge status: Home / Self Care | Payer: Self-pay | Source: Home / Self Care | Attending: Neurosurgery

## 2019-01-25 ENCOUNTER — Other Ambulatory Visit: Payer: Self-pay

## 2019-01-25 ENCOUNTER — Inpatient Hospital Stay (HOSPITAL_COMMUNITY)
Admission: RE | Admit: 2019-01-25 | Discharge: 2019-01-26 | DRG: 455 | Disposition: A | Discharge status: Home / Self Care | Payer: No Typology Code available for payment source | Attending: Neurosurgery | Admitting: Neurosurgery

## 2019-01-25 DIAGNOSIS — Z79891 Long term (current) use of opiate analgesic: Secondary | ICD-10-CM | POA: Diagnosis not present

## 2019-01-25 DIAGNOSIS — Z20828 Contact with and (suspected) exposure to other viral communicable diseases: Secondary | ICD-10-CM | POA: Diagnosis present

## 2019-01-25 DIAGNOSIS — M4316 Spondylolisthesis, lumbar region: Principal | ICD-10-CM

## 2019-01-25 DIAGNOSIS — Z419 Encounter for procedure for purposes other than remedying health state, unspecified: Secondary | ICD-10-CM

## 2019-01-25 DIAGNOSIS — M48061 Spinal stenosis, lumbar region without neurogenic claudication: Secondary | ICD-10-CM | POA: Diagnosis present

## 2019-01-25 DIAGNOSIS — Z79899 Other long term (current) drug therapy: Secondary | ICD-10-CM

## 2019-01-25 DIAGNOSIS — M5416 Radiculopathy, lumbar region: Secondary | ICD-10-CM | POA: Diagnosis present

## 2019-01-25 LAB — TYPE AND SCREEN
ABO/RH(D): A POS
Antibody Screen: NEGATIVE

## 2019-01-25 LAB — CBC WITH DIFFERENTIAL/PLATELET
Abs Immature Granulocytes: 0.01 10*3/uL (ref 0.00–0.07)
Basophils Absolute: 0.1 10*3/uL (ref 0.0–0.1)
Basophils Relative: 2 %
Eosinophils Absolute: 0.1 10*3/uL (ref 0.0–0.5)
Eosinophils Relative: 2 %
HCT: 49.2 % (ref 39.0–52.0)
Hemoglobin: 16.4 g/dL (ref 13.0–17.0)
Immature Granulocytes: 0 %
Lymphocytes Relative: 32 %
Lymphs Abs: 1.2 10*3/uL (ref 0.7–4.0)
MCH: 30.8 pg (ref 26.0–34.0)
MCHC: 33.3 g/dL (ref 30.0–36.0)
MCV: 92.3 fL (ref 80.0–100.0)
Monocytes Absolute: 0.5 10*3/uL (ref 0.1–1.0)
Monocytes Relative: 14 %
Neutro Abs: 2 10*3/uL (ref 1.7–7.7)
Neutrophils Relative %: 50 %
Platelets: 210 10*3/uL (ref 150–400)
RBC: 5.33 MIL/uL (ref 4.22–5.81)
RDW: 13.4 % (ref 11.5–15.5)
WBC: 3.9 10*3/uL — ABNORMAL LOW (ref 4.0–10.5)
nRBC: 0 % (ref 0.0–0.2)

## 2019-01-25 LAB — ABO/RH: ABO/RH(D): A POS

## 2019-01-25 SURGERY — POSTERIOR LUMBAR FUSION 1 LEVEL
Anesthesia: General | Site: Back

## 2019-01-25 MED ORDER — FENTANYL CITRATE (PF) 250 MCG/5ML IJ SOLN
INTRAMUSCULAR | Status: AC
  Filled 2019-01-25: qty 5

## 2019-01-25 MED ORDER — CHLORHEXIDINE GLUCONATE CLOTH 2 % EX PADS: 6.0000 | MEDICATED_PAD | Freq: Once | CUTANEOUS | Status: DC

## 2019-01-25 MED ORDER — ONDANSETRON HCL 4 MG/2ML IJ SOLN
INTRAMUSCULAR | Status: AC
  Filled 2019-01-25: qty 2

## 2019-01-25 MED ORDER — HYDROMORPHONE HCL 1 MG/ML IJ SOLN: 1.0000 mg | INTRAMUSCULAR | Status: DC | PRN

## 2019-01-25 MED ORDER — MIDAZOLAM HCL 2 MG/2ML IJ SOLN
INTRAMUSCULAR | Status: AC
  Filled 2019-01-25: qty 2

## 2019-01-25 MED ORDER — HYDROMORPHONE HCL 1 MG/ML IJ SOLN
0.2500 mg | INTRAMUSCULAR | Status: DC | PRN
  Administered 2019-01-25 (×2): 0.5 mg via INTRAVENOUS

## 2019-01-25 MED ORDER — ROCURONIUM BROMIDE 10 MG/ML (PF) SYRINGE
PREFILLED_SYRINGE | INTRAVENOUS | Status: AC
  Filled 2019-01-25: qty 20

## 2019-01-25 MED ORDER — PHENYLEPHRINE 40 MCG/ML (10ML) SYRINGE FOR IV PUSH (FOR BLOOD PRESSURE SUPPORT)
PREFILLED_SYRINGE | INTRAVENOUS | Status: DC | PRN
  Administered 2019-01-25: 40 ug via INTRAVENOUS
  Administered 2019-01-25: 80 ug via INTRAVENOUS
  Administered 2019-01-25 (×2): 40 ug via INTRAVENOUS

## 2019-01-25 MED ORDER — SODIUM CHLORIDE 0.9% FLUSH: 3.0000 mL | INTRAVENOUS | Status: DC | PRN

## 2019-01-25 MED ORDER — PHENOL 1.4 % MT LIQD: 1.0000 | OROMUCOSAL | Status: DC | PRN

## 2019-01-25 MED ORDER — NAPHAZOLINE-GLYCERIN 0.012-0.2 % OP SOLN
1.0000 [drp] | Freq: Every day | OPHTHALMIC | Status: DC | PRN
  Filled 2019-01-25: qty 15

## 2019-01-25 MED ORDER — SODIUM CHLORIDE 0.9 % IV SOLN
INTRAVENOUS | Status: DC | PRN
  Administered 2019-01-25: 500 mL

## 2019-01-25 MED ORDER — LIDOCAINE 2% (20 MG/ML) 5 ML SYRINGE
INTRAMUSCULAR | Status: DC | PRN
  Administered 2019-01-25: 100 mg via INTRAVENOUS

## 2019-01-25 MED ORDER — ONDANSETRON HCL 4 MG/2ML IJ SOLN
INTRAMUSCULAR | Status: DC | PRN
  Administered 2019-01-25: 4 mg via INTRAVENOUS

## 2019-01-25 MED ORDER — THROMBIN 20000 UNITS EX SOLR
CUTANEOUS | Status: AC
  Filled 2019-01-25: qty 20000

## 2019-01-25 MED ORDER — ONDANSETRON HCL 4 MG/2ML IJ SOLN
4.0000 mg | Freq: Four times a day (QID) | INTRAMUSCULAR | Status: DC | PRN
  Administered 2019-01-25: 4 mg via INTRAVENOUS

## 2019-01-25 MED ORDER — HYDROXYZINE HCL 50 MG/ML IM SOLN: 50.0000 mg | Freq: Four times a day (QID) | INTRAMUSCULAR | Status: DC | PRN

## 2019-01-25 MED ORDER — THROMBIN 5000 UNITS EX SOLR
CUTANEOUS | Status: AC
  Filled 2019-01-25: qty 5000

## 2019-01-25 MED ORDER — DEXAMETHASONE SODIUM PHOSPHATE 10 MG/ML IJ SOLN
10.0000 mg | Freq: Once | INTRAMUSCULAR | Status: AC
  Administered 2019-01-25: 12:00:00 10 mg via INTRAVENOUS
  Filled 2019-01-25: qty 1

## 2019-01-25 MED ORDER — VANCOMYCIN HCL 1000 MG IV SOLR
INTRAVENOUS | Status: DC | PRN
  Administered 2019-01-25: 1000 mg

## 2019-01-25 MED ORDER — ACETAMINOPHEN 650 MG RE SUPP: 650.0000 mg | RECTAL | Status: DC | PRN

## 2019-01-25 MED ORDER — BISACODYL 10 MG RE SUPP: 10.0000 mg | Freq: Every day | RECTAL | Status: DC | PRN

## 2019-01-25 MED ORDER — ROCURONIUM BROMIDE 10 MG/ML (PF) SYRINGE
PREFILLED_SYRINGE | INTRAVENOUS | Status: DC | PRN
  Administered 2019-01-25: 20 mg via INTRAVENOUS
  Administered 2019-01-25: 30 mg via INTRAVENOUS
  Administered 2019-01-25: 50 mg via INTRAVENOUS

## 2019-01-25 MED ORDER — MENTHOL 3 MG MT LOZG: 1.0000 | LOZENGE | OROMUCOSAL | Status: DC | PRN

## 2019-01-25 MED ORDER — HYDROCODONE-ACETAMINOPHEN 10-325 MG PO TABS: 1.0000 | ORAL_TABLET | ORAL | Status: DC | PRN

## 2019-01-25 MED ORDER — HYDROMORPHONE HCL 1 MG/ML IJ SOLN: 0.2500 mg | INTRAMUSCULAR | Status: DC | PRN

## 2019-01-25 MED ORDER — CEFAZOLIN SODIUM-DEXTROSE 1-4 GM/50ML-% IV SOLN
1.0000 g | Freq: Three times a day (TID) | INTRAVENOUS | Status: AC
  Administered 2019-01-25 – 2019-01-26 (×2): 1 g via INTRAVENOUS
  Filled 2019-01-25 (×2): qty 50

## 2019-01-25 MED ORDER — CEFAZOLIN SODIUM-DEXTROSE 2-4 GM/100ML-% IV SOLN
2.0000 g | INTRAVENOUS | Status: AC
  Administered 2019-01-25: 12:00:00 2 g via INTRAVENOUS

## 2019-01-25 MED ORDER — ACETAMINOPHEN 325 MG PO TABS
650.0000 mg | ORAL_TABLET | ORAL | Status: DC | PRN
  Administered 2019-01-26: 650 mg via ORAL
  Filled 2019-01-25: qty 2

## 2019-01-25 MED ORDER — SUGAMMADEX SODIUM 200 MG/2ML IV SOLN
INTRAVENOUS | Status: DC | PRN
  Administered 2019-01-25: 200 mg via INTRAVENOUS

## 2019-01-25 MED ORDER — FLEET ENEMA 7-19 GM/118ML RE ENEM: 1.0000 | ENEMA | Freq: Once | RECTAL | Status: DC | PRN

## 2019-01-25 MED ORDER — CEFAZOLIN SODIUM-DEXTROSE 2-4 GM/100ML-% IV SOLN
INTRAVENOUS | Status: AC
  Filled 2019-01-25: qty 100

## 2019-01-25 MED ORDER — PROPOFOL 10 MG/ML IV BOLUS
INTRAVENOUS | Status: AC
  Filled 2019-01-25: qty 20

## 2019-01-25 MED ORDER — SODIUM CHLORIDE 0.9 % IV SOLN
INTRAVENOUS | Status: DC | PRN
  Administered 2019-01-25: 12:00:00 10 ug/min via INTRAVENOUS

## 2019-01-25 MED ORDER — FENTANYL CITRATE (PF) 250 MCG/5ML IJ SOLN
INTRAMUSCULAR | Status: DC | PRN
  Administered 2019-01-25: 150 ug via INTRAVENOUS
  Administered 2019-01-25 (×2): 50 ug via INTRAVENOUS

## 2019-01-25 MED ORDER — POLYETHYLENE GLYCOL 3350 17 G PO PACK: 17.0000 g | PACK | Freq: Every day | ORAL | Status: DC | PRN

## 2019-01-25 MED ORDER — DEXAMETHASONE SODIUM PHOSPHATE 10 MG/ML IJ SOLN
INTRAMUSCULAR | Status: AC
  Filled 2019-01-25: qty 1

## 2019-01-25 MED ORDER — SODIUM CHLORIDE 0.9% FLUSH
3.0000 mL | Freq: Two times a day (BID) | INTRAVENOUS | Status: DC
  Administered 2019-01-25: 3 mL via INTRAVENOUS

## 2019-01-25 MED ORDER — LACTATED RINGERS IV SOLN
INTRAVENOUS | Status: DC
  Administered 2019-01-25 (×2): via INTRAVENOUS

## 2019-01-25 MED ORDER — 0.9 % SODIUM CHLORIDE (POUR BTL) OPTIME
TOPICAL | Status: DC | PRN
  Administered 2019-01-25: 12:00:00 1000 mL

## 2019-01-25 MED ORDER — MIDAZOLAM HCL 2 MG/2ML IJ SOLN
INTRAMUSCULAR | Status: DC | PRN
  Administered 2019-01-25: 2 mg via INTRAVENOUS

## 2019-01-25 MED ORDER — BUPIVACAINE HCL (PF) 0.25 % IJ SOLN
INTRAMUSCULAR | Status: AC
  Filled 2019-01-25: qty 30

## 2019-01-25 MED ORDER — HYDROMORPHONE HCL 1 MG/ML IJ SOLN
INTRAMUSCULAR | Status: AC
  Administered 2019-01-25: 0.5 mg via INTRAVENOUS
  Filled 2019-01-25: qty 1

## 2019-01-25 MED ORDER — ONDANSETRON HCL 4 MG/2ML IJ SOLN
INTRAMUSCULAR | Status: AC
  Filled 2019-01-25: qty 4

## 2019-01-25 MED ORDER — LIDOCAINE 2% (20 MG/ML) 5 ML SYRINGE
INTRAMUSCULAR | Status: AC
  Filled 2019-01-25: qty 10

## 2019-01-25 MED ORDER — VANCOMYCIN HCL 1000 MG IV SOLR
INTRAVENOUS | Status: AC
  Filled 2019-01-25: qty 1000

## 2019-01-25 MED ORDER — PROPOFOL 10 MG/ML IV BOLUS
INTRAVENOUS | Status: DC | PRN
  Administered 2019-01-25: 170 mg via INTRAVENOUS

## 2019-01-25 MED ORDER — OXYCODONE HCL 5 MG PO TABS
10.0000 mg | ORAL_TABLET | ORAL | Status: DC | PRN
  Administered 2019-01-25 – 2019-01-26 (×2): 10 mg via ORAL
  Filled 2019-01-25 (×2): qty 2

## 2019-01-25 MED ORDER — ONDANSETRON HCL 4 MG PO TABS
4.0000 mg | ORAL_TABLET | Freq: Four times a day (QID) | ORAL | Status: DC | PRN
  Filled 2019-01-25: qty 1

## 2019-01-25 MED ORDER — SODIUM CHLORIDE 0.9 % IV SOLN
250.0000 mL | INTRAVENOUS | Status: DC
  Administered 2019-01-25: 250 mL via INTRAVENOUS

## 2019-01-25 MED ORDER — BUPIVACAINE HCL (PF) 0.25 % IJ SOLN
INTRAMUSCULAR | Status: DC | PRN
  Administered 2019-01-25: 20 mL

## 2019-01-25 MED ORDER — THROMBIN 20000 UNITS EX SOLR
CUTANEOUS | Status: DC | PRN
  Administered 2019-01-25: 12:00:00 20 mL via TOPICAL

## 2019-01-25 MED ORDER — DIAZEPAM 5 MG PO TABS
5.0000 mg | ORAL_TABLET | Freq: Four times a day (QID) | ORAL | Status: DC | PRN
  Administered 2019-01-25: 5 mg via ORAL
  Filled 2019-01-25: qty 1

## 2019-01-25 SURGICAL SUPPLY — 67 items
BAG DECANTER FOR FLEXI CONT (MISCELLANEOUS) ×2 IMPLANT
BENZOIN TINCTURE PRP APPL 2/3 (GAUZE/BANDAGES/DRESSINGS) ×2 IMPLANT
BLADE CLIPPER SURG (BLADE) IMPLANT
BUR CUTTER 7.0 ROUND (BURR) IMPLANT
BUR MATCHSTICK NEURO 3.0 LAGG (BURR) ×2 IMPLANT
CANISTER SUCT 3000ML PPV (MISCELLANEOUS) ×2 IMPLANT
CAP LCK SPNE (Orthopedic Implant) ×4 IMPLANT
CAP LOCK SPINE RADIUS (Orthopedic Implant) ×4 IMPLANT
CAP LOCKING (Orthopedic Implant) ×4 IMPLANT
CARTRIDGE OIL MAESTRO DRILL (MISCELLANEOUS) ×1 IMPLANT
CLSR STERI-STRIP ANTIMIC 1/2X4 (GAUZE/BANDAGES/DRESSINGS) ×2 IMPLANT
CONT SPEC 4OZ CLIKSEAL STRL BL (MISCELLANEOUS) ×2 IMPLANT
COVER BACK TABLE 60X90IN (DRAPES) ×2 IMPLANT
COVER WAND RF STERILE (DRAPES) ×2 IMPLANT
DECANTER SPIKE VIAL GLASS SM (MISCELLANEOUS) ×2 IMPLANT
DERMABOND ADVANCED (GAUZE/BANDAGES/DRESSINGS) ×1
DERMABOND ADVANCED .7 DNX12 (GAUZE/BANDAGES/DRESSINGS) ×1 IMPLANT
DEVICE INTERBODY ELEVATE 10X23 (Cage) ×4 IMPLANT
DIFFUSER DRILL AIR PNEUMATIC (MISCELLANEOUS) ×2 IMPLANT
DRAPE C-ARM 42X72 X-RAY (DRAPES) ×4 IMPLANT
DRAPE HALF SHEET 40X57 (DRAPES) IMPLANT
DRAPE LAPAROTOMY 100X72X124 (DRAPES) ×2 IMPLANT
DRAPE SURG 17X23 STRL (DRAPES) ×8 IMPLANT
DRSG OPSITE POSTOP 4X6 (GAUZE/BANDAGES/DRESSINGS) ×2 IMPLANT
DURAPREP 26ML APPLICATOR (WOUND CARE) ×2 IMPLANT
ELECT REM PT RETURN 9FT ADLT (ELECTROSURGICAL) ×2
ELECTRODE REM PT RTRN 9FT ADLT (ELECTROSURGICAL) ×1 IMPLANT
EVACUATOR 1/8 PVC DRAIN (DRAIN) IMPLANT
GAUZE 4X4 16PLY RFD (DISPOSABLE) IMPLANT
GAUZE SPONGE 4X4 12PLY STRL (GAUZE/BANDAGES/DRESSINGS) IMPLANT
GLOVE BIO SURGEON STRL SZ 6.5 (GLOVE) ×6 IMPLANT
GLOVE BIOGEL PI IND STRL 6.5 (GLOVE) ×3 IMPLANT
GLOVE BIOGEL PI IND STRL 7.0 (GLOVE) ×1 IMPLANT
GLOVE BIOGEL PI IND STRL 7.5 (GLOVE) ×3 IMPLANT
GLOVE BIOGEL PI IND STRL 8 (GLOVE) ×3 IMPLANT
GLOVE BIOGEL PI INDICATOR 6.5 (GLOVE) ×3
GLOVE BIOGEL PI INDICATOR 7.0 (GLOVE) ×1
GLOVE BIOGEL PI INDICATOR 7.5 (GLOVE) ×3
GLOVE BIOGEL PI INDICATOR 8 (GLOVE) ×3
GLOVE ECLIPSE 7.5 STRL STRAW (GLOVE) ×8 IMPLANT
GLOVE ECLIPSE 9.0 STRL (GLOVE) ×4 IMPLANT
GLOVE EXAM NITRILE XL STR (GLOVE) IMPLANT
GOWN STRL REUS W/ TWL LRG LVL3 (GOWN DISPOSABLE) ×3 IMPLANT
GOWN STRL REUS W/ TWL XL LVL3 (GOWN DISPOSABLE) ×3 IMPLANT
GOWN STRL REUS W/TWL 2XL LVL3 (GOWN DISPOSABLE) ×4 IMPLANT
GOWN STRL REUS W/TWL LRG LVL3 (GOWN DISPOSABLE) ×3
GOWN STRL REUS W/TWL XL LVL3 (GOWN DISPOSABLE) ×3
KIT BASIN OR (CUSTOM PROCEDURE TRAY) ×2 IMPLANT
KIT TURNOVER KIT B (KITS) ×2 IMPLANT
MILL MEDIUM DISP (BLADE) ×2 IMPLANT
NEEDLE HYPO 22GX1.5 SAFETY (NEEDLE) ×2 IMPLANT
NS IRRIG 1000ML POUR BTL (IV SOLUTION) ×2 IMPLANT
OIL CARTRIDGE MAESTRO DRILL (MISCELLANEOUS) ×2
PACK LAMINECTOMY NEURO (CUSTOM PROCEDURE TRAY) ×2 IMPLANT
ROD RADIUS 35MM (Rod) ×4 IMPLANT
SCREW 5.75X45MM (Screw) ×8 IMPLANT
SPONGE LAP 4X18 RFD (DISPOSABLE) ×2 IMPLANT
SPONGE SURGIFOAM ABS GEL 100 (HEMOSTASIS) ×2 IMPLANT
STRIP CLOSURE SKIN 1/2X4 (GAUZE/BANDAGES/DRESSINGS) ×2 IMPLANT
SUT VIC AB 0 CT1 18XCR BRD8 (SUTURE) ×1 IMPLANT
SUT VIC AB 0 CT1 8-18 (SUTURE) ×1
SUT VIC AB 2-0 CT1 18 (SUTURE) ×2 IMPLANT
SUT VIC AB 3-0 SH 8-18 (SUTURE) ×4 IMPLANT
TOWEL GREEN STERILE (TOWEL DISPOSABLE) ×2 IMPLANT
TOWEL GREEN STERILE FF (TOWEL DISPOSABLE) ×2 IMPLANT
TRAY FOLEY MTR SLVR 16FR STAT (SET/KITS/TRAYS/PACK) ×2 IMPLANT
WATER STERILE IRR 1000ML POUR (IV SOLUTION) ×2 IMPLANT
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
## 2019-01-25 NOTE — Brief Op Note (Signed)
01/25/2019  1:58 PM  PATIENT:  Ralph Muse Sr.  68 y.o. male  PRE-OPERATIVE DIAGNOSIS:  Spondylolisthesis  POST-OPERATIVE DIAGNOSIS:  Spondylolisthesis  PROCEDURE:  Procedure(s): POSTERIOR LUMBAR INTERBODY FUSION - LUMBAR FOUR-LUMBAR FIVE (N/A)  SURGEON:  Surgeon(s) and Role:    * Earnie Larsson, MD - Primary  PHYSICIAN ASSISTANT:   ASSISTANTSMearl Latin   ANESTHESIA:   general  EBL:  150 cc   BLOOD ADMINISTERED:none  DRAINS: none   LOCAL MEDICATIONS USED:  MARCAINE     SPECIMEN:  No Specimen  DISPOSITION OF SPECIMEN:  N/A  COUNTS:  YES  TOURNIQUET:  * No tourniquets in log *  DICTATION: .Dragon Dictation  PLAN OF CARE: Admit to inpatient   PATIENT DISPOSITION:  PACU - hemodynamically stable.   Delay start of Pharmacological VTE agent (>24hrs) due to surgical blood loss or risk of bleeding: yes

## 2019-01-25 NOTE — Anesthesia Postprocedure Evaluation (Signed)
Anesthesia Post Note  Patient: Ralph MACARI Sr.  Procedure(s) Performed: POSTERIOR LUMBAR INTERBODY FUSION - LUMBAR FOUR-LUMBAR FIVE (N/A Back)     Patient location during evaluation: PACU Anesthesia Type: General Level of consciousness: awake Pain management: pain level controlled Vital Signs Assessment: post-procedure vital signs reviewed and stable Respiratory status: spontaneous breathing Cardiovascular status: stable Postop Assessment: no apparent nausea or vomiting Anesthetic complications: no    Last Vitals:  Vitals:   01/25/19 1509 01/25/19 1525  BP: 122/80 (!) 145/95  Pulse: 69 71  Resp: 13 16  Temp: (!) 36.4 C 36.8 C  SpO2: 97% 96%    Last Pain:  Vitals:   01/25/19 1525  TempSrc: Oral  PainSc:                  [name]

## 2019-01-25 NOTE — Op Note (Signed)
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  L4-5 posterior lumbar interbody fusion utilizing interbody cages and locally harvested autograft  L4-5 posterior lateral arthrodesis utilizing nonsegmental pedicle screw fixation and local autograft  Surgeon:Anthonia Monger A.Haziel Molner, M.D.  Asst. Surgeon: Reinaldo Meeker, NP  Anesthesia: General  Indication: 68 year old male with back and bilateral lower extremity pain consistent with L4 radiculopathy is failing conservative management.  Work-up demonstrates evidence of unstable grade 2 L4-5 lytic spondylolisthesis with severe foraminal stenosis.  Patient presents now for decompression and fusion in hopes improving his symptoms.  Operative note: After induction of anesthesia, patient positioned prone on the Wilson frame and appropriately padded.  Lumbar region prepped and draped sterilely.  Incision made along L4-5.  Dissection performed bilaterally.  Retractor placed.  Fluoroscopy used.  Levels confirmed.  Complete laminectomy of L4 was performed using Leksell rongeurs and Kerrison rongeurs.  Inferior facetectomies at L4 including the rudimentary pars defect of L4 bilaterally were performed.  Superior facetectomies L5 were performed.  Ligament flavum elevated and resected.  Foraminotomies completed on the course the exiting L4 and L5 nerve roots in excess will be required for interbody fusion alone.  Epidural venous plexus coagulated and cut.  Bilateral discectomies then performed at L4-5.  Patient then prepared for a body fusion.  With a distractor placed patient's right side the deformity was reduced.  Soft tissue removed interspace.  A 10 mm Medtronic expandable cage packed  with locally harvested autograft and impacted in place and expanded to its full extent.  Distractor removed patient's right side.  The space prepared on the right side.  Soft tissue interspace.  Morselized autograft packed in their space.  A second cage packed with autograft was then impacted in place and expanded to its full extent.  Pedicles of L4 and L5 identified using surface landmarks and intraoperative fluoroscopy.  Superficial bone around the pedicles and removed using high-speed drill.  Pedicle was then probed using pedicle ALL track was then probed and found to be solid within the bone.  Each pedicle tract was then tapped with a screw tap and then 5.75 mm radius brand screws from [employer] were placed bilaterally.  Final images reveal good position the cages and the hardware at the proper upper level with improved alignment of the spine.  Short segment titanium rod placed of the screws at L4 and L5.  Locking caps which of the screws.  Locking caps and engaged with construct under compression.  Transverse processes and residual disc were decorticated.  Morselized autograft packed posterior laterally.  Gelfoam was placed over the laminectomy defect.  Vancomycin powder placed in deep wound space.  Wounds and closed in layers with Vicryl sutures.  Steri-Strips and sterile dressing were applied.  No apparent complications.  Patient tolerated the procedure well and he returned to recovery room postop.

## 2019-01-25 NOTE — Anesthesia Preprocedure Evaluation (Addendum)
Anesthesia Evaluation  Patient identified by MRN, date of birth, ID band Patient awake    Reviewed: Allergy & Precautions, NPO status , Patient's Chart, lab work & pertinent test results  Airway Mallampati: II  TM Distance: >3 FB     Dental   Pulmonary    breath sounds clear to auscultation       Cardiovascular negative cardio ROS   Rhythm:Regular Rate:Normal     Neuro/Psych    GI/Hepatic negative GI ROS, Neg liver ROS,   Endo/Other  negative endocrine ROS  Renal/GU negative Renal ROS     Musculoskeletal   Abdominal   Peds  Hematology   Anesthesia Other Findings   Reproductive/Obstetrics                             Anesthesia Physical Anesthesia Plan  ASA: III  Anesthesia Plan: General   Post-op Pain Management:    Induction: Intravenous  PONV Risk Score and Plan: 2 and Ondansetron and Dexamethasone  Airway Management Planned: Oral ETT  Additional Equipment:   Intra-op Plan:   Post-operative Plan: Possible Post-op intubation/ventilation  Informed Consent: I have reviewed the patients History and Physical, chart, labs and discussed the procedure including the risks, benefits and alternatives for the proposed anesthesia with the patient or authorized representative who has indicated his/her understanding and acceptance.     Dental advisory given  Plan Discussed with: CRNA and Anesthesiologist  Anesthesia Plan Comments:        Anesthesia Quick Evaluation

## 2019-01-25 NOTE — Transfer of Care (Signed)
Immediate Anesthesia Transfer of Care Note  Patient: Ralph Muse Sr.  Procedure(s) Performed: POSTERIOR LUMBAR INTERBODY FUSION - LUMBAR FOUR-LUMBAR FIVE (N/A Back)  Patient Location: PACU  Anesthesia Type:General  Level of Consciousness: awake, alert , oriented and patient cooperative  Airway & Oxygen Therapy: Patient Spontanous Breathing and Patient connected to nasal cannula oxygen  Post-op Assessment: Report given to RN and Post -op Vital signs reviewed and stable  Post vital signs: Reviewed and stable  Last Vitals:  Vitals Value Taken Time  BP 128/95 01/25/19 1403  Temp 36.3 C 01/25/19 1403  Pulse 71 01/25/19 1404  Resp 14 01/25/19 1404  SpO2 100 % 01/25/19 1404  Vitals shown include unvalidated device data.  Last Pain:  Vitals:   01/25/19 0949  TempSrc:   PainSc: 4       Patients Stated Pain Goal: 3 ([id number])  Complications: No apparent anesthesia complications

## 2019-01-25 NOTE — Anesthesia Procedure Notes (Signed)
Procedure Name: Intubation Date/Time: 01/25/2019 11:29 AM Performed by: Janace Litten, CRNA Pre-anesthesia Checklist: Patient identified, Emergency Drugs available, Suction available and Patient being monitored Patient Re-evaluated:Patient Re-evaluated prior to induction Oxygen Delivery Method: Circle System Utilized Preoxygenation: Pre-oxygenation with 100% oxygen Induction Type: IV induction Ventilation: Mask ventilation without difficulty Laryngoscope Size: Mac and 4 Grade View: Grade I Tube type: Oral Tube size: 7.5 mm Number of attempts: 1 Airway Equipment and Method: Stylet Placement Confirmation: ETT inserted through vocal cords under direct vision,  positive ETCO2 and breath sounds checked- equal and bilateral Secured at: 23 cm Tube secured with: Tape Dental Injury: Teeth and Oropharynx as per pre-operative assessment

## 2019-01-26 MED ORDER — DIAZEPAM 5 MG PO TABS: 5.0000 mg | ORAL_TABLET | Freq: Four times a day (QID) | ORAL | 0 refills | Status: AC | PRN

## 2019-01-26 MED ORDER — HYDROCODONE-ACETAMINOPHEN 10-325 MG PO TABS: 1.0000 | ORAL_TABLET | ORAL | 0 refills | Status: AC | PRN

## 2019-01-26 NOTE — Discharge Instructions (Signed)
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
## 2019-01-26 NOTE — Plan of Care (Signed)
Patient alert and oriented, mae's well, voiding adequate amount of urine, swallowing without difficulty, no c/o pain. Patient discharged home with family. Script and discharged instructions given to patient. Patient and family stated understanding of d/c instructions given and has an appointment with Dr. Annette Stable in two weeks
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
## 2019-01-26 NOTE — Discharge Summary (Signed)
Physician Discharge Summary  Patient ID: Ralph Franklin. MRN: [id number] DOB/AGE: 09-25-1950 68 y.o.  Admit date: 01/25/2019 Discharge date: 01/26/2019  Admission Diagnoses:  Discharge Diagnoses:  Active Problems:   Spondylolisthesis at L4-L5 level   Discharged Condition: good  Hospital Course: Patient admitted to the hospital where he underwent uncomplicated [id number] decompression and fusion surgery for treatment of his unstable grade 2 lytic spondylolisthesis at L4-5.  Postop Truman Hayward doing very well.  Preoperative back and lower extremity pain very much improved.  Standing walking and voiding without difficulty.  Ready for discharge home.  Consults:   Significant Diagnostic Studies:   Treatments:   Discharge Exam: Blood pressure 120/71, pulse (!) 101, temperature 98.1 F (36.7 C), temperature source Oral, resp. rate 18, height 5\' 11"  (1.803 m), weight 99.8 kg, SpO2 100 %. Awake and alert.  Oriented and appropriate.  Speech fluent.  Judgment insight intact.  Motor and sensory function of the extremities normal.  Wound clean and dry.  Chest and abdomen benign.  Disposition: Discharge disposition: 01-Home or Self Care        Allergies as of 01/26/2019   No Known Allergies     Medication List    STOP taking these medications   CLEAR EYES REDNESS RELIEF OP   HYDROcodone-acetaminophen 5-325 MG tablet Commonly known as: Norco Replaced by: HYDROcodone-acetaminophen 10-325 MG tablet   methocarbamol 500 MG tablet Commonly known as: ROBAXIN   NYQUIL PO     TAKE these medications   diazepam 5 MG tablet Commonly known as: VALIUM Take 1-2 tablets (5-10 mg total) by mouth every 6 (six) hours as needed for muscle spasms.   HYDROcodone-acetaminophen 10-325 MG tablet Commonly known as: NORCO Take 1-2 tablets by mouth every 4 (four) hours as needed for moderate pain ((score 4 to 6)). Replaces: HYDROcodone-acetaminophen 5-325 MG tablet            Durable Medical  Equipment  (From admission, onward)         Start     Ordered   01/25/19 1524  DME Walker rolling  Once    Question:  Patient needs a walker to treat with the following condition  Answer:  Spondylolisthesis at L4-L5 level   01/25/19 1523   01/25/19 1524  DME 3 n 1  Once     01/25/19 1523           Signed: Mallie Mussel A Bricelyn Freestone 01/26/2019, 10:21 AM

## 2019-01-28 MED FILL — Sodium Chloride IV Soln 0.9%: INTRAVENOUS | Qty: 1000 | Status: AC

## 2019-01-28 MED FILL — Heparin Sodium (Porcine) Inj 1000 Unit/ML: INTRAMUSCULAR | Qty: 30 | Status: AC

## 2020-07-24 IMAGING — RF DG C-ARM 1-60 MIN
1 series · 2 of 2 positions shown · non-contrast
Comparison: Preoperative radiographs 01/20/2019

CLINICAL DATA: 67-year-old male undergoing posterior lumbar
interbody fusion at L4-L5

EXAM:
LUMBAR SPINE - 2-3 VIEW; DG C-ARM 1-60 MIN

[Series 1: run · 2 of 2 slices shown]
[im 1/2]
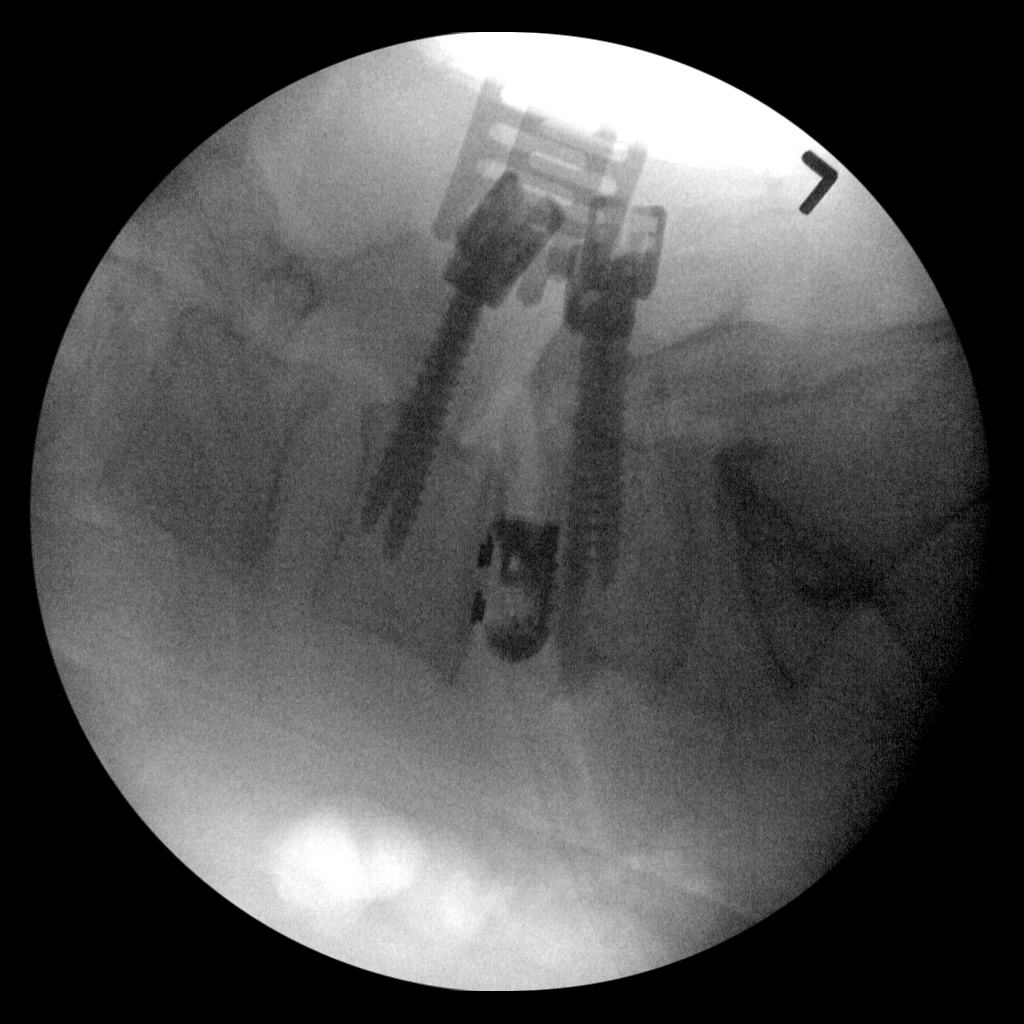
[im 2/2]
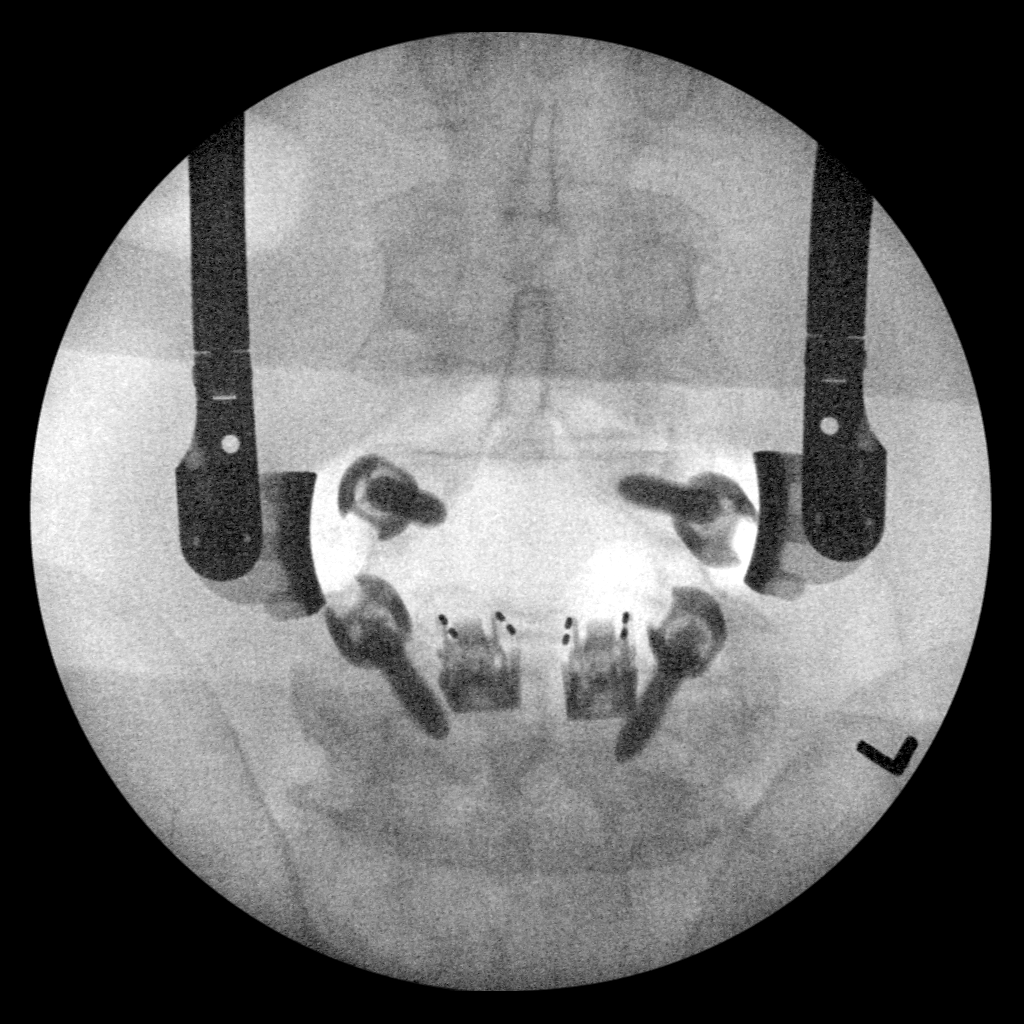

[2 of 2 positions shown; findings below may reference images not displayed]

FINDINGS: Frontal and lateral intraoperative images obtained during posterior
lumbar interbody fusion are submitted for review. The images
demonstrate bilateral pedicle screws at L4 and L5 with an interbody
grafts in the disc interspace. Soft tissue retractors noted
posteriorly. No evidence of hardware complication.
IMPRESSION: In progress L4-L5 posterior lumbar interbody fusion.

## 2020-07-24 IMAGING — RF DG LUMBAR SPINE 2-3V
1 series · 2 of 2 positions shown · non-contrast
Comparison: Preoperative radiographs 01/20/2019

CLINICAL DATA: 67-year-old male undergoing posterior lumbar
interbody fusion at L4-L5

EXAM:
LUMBAR SPINE - 2-3 VIEW; DG C-ARM 1-60 MIN

[Series 1: run · 2 of 2 slices shown]
[im 1/2]
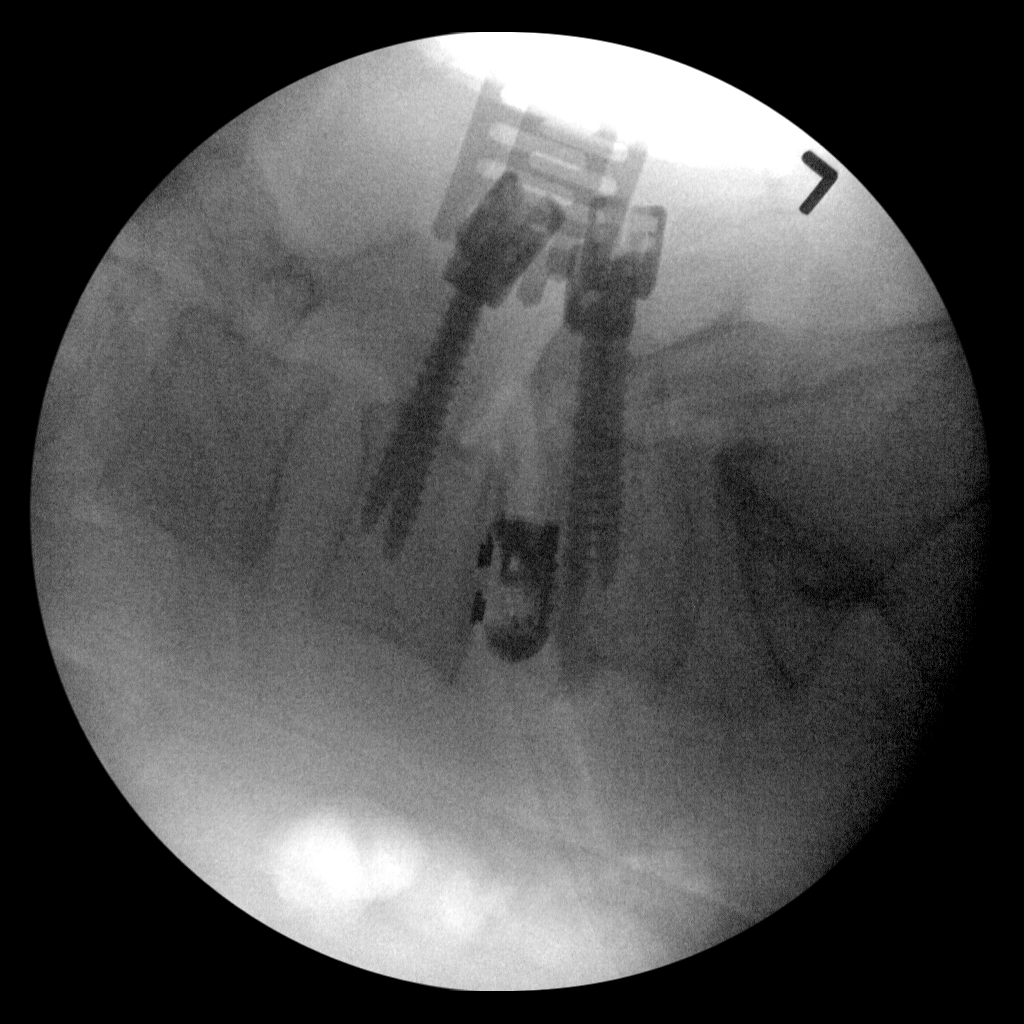
[im 2/2]
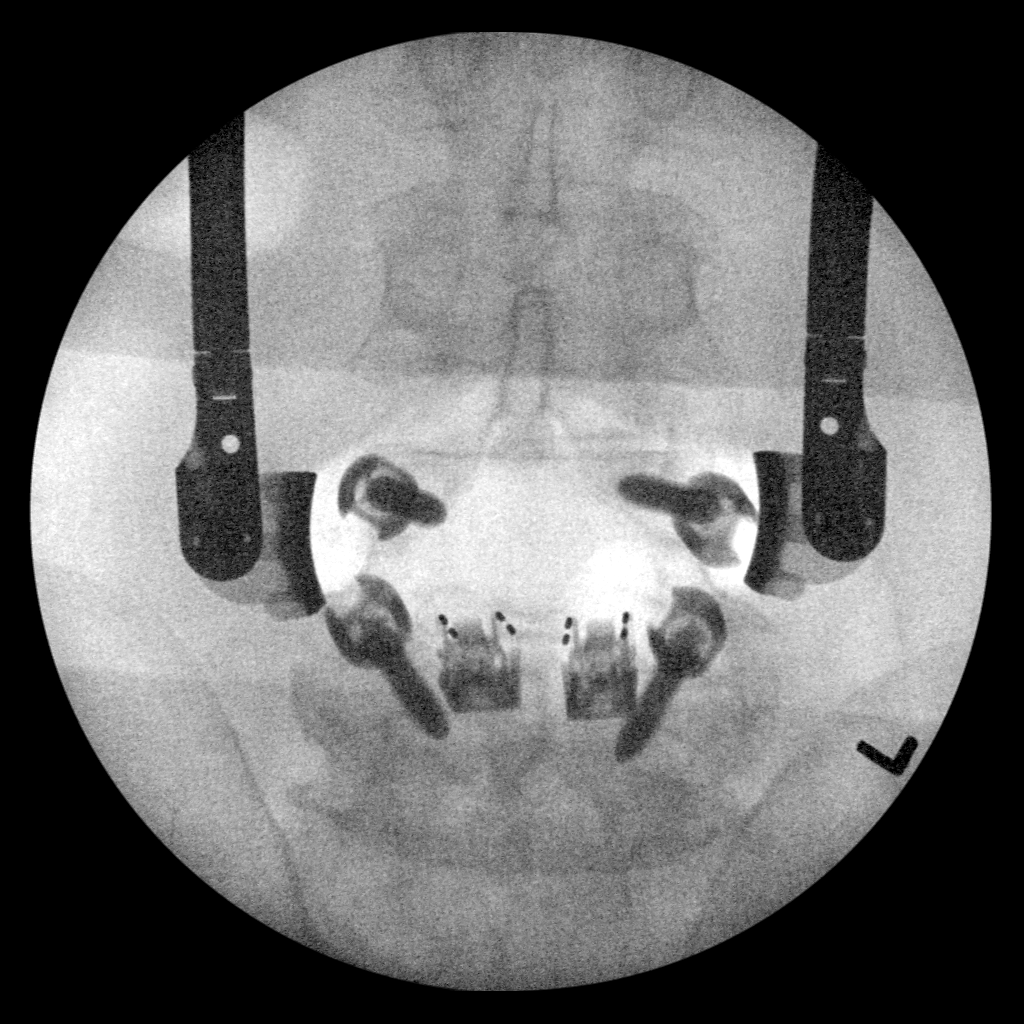

[2 of 2 positions shown; findings below may reference images not displayed]

FINDINGS: Frontal and lateral intraoperative images obtained during posterior
lumbar interbody fusion are submitted for review. The images
demonstrate bilateral pedicle screws at L4 and L5 with an interbody
grafts in the disc interspace. Soft tissue retractors noted
posteriorly. No evidence of hardware complication.
IMPRESSION: In progress L4-L5 posterior lumbar interbody fusion.

## 2021-07-06 ENCOUNTER — Other Ambulatory Visit: Payer: Self-pay | Admitting: Emergency Medicine

## 2021-07-06 DIAGNOSIS — R911 Solitary pulmonary nodule: Secondary | ICD-10-CM

## 2021-07-11 ENCOUNTER — Other Ambulatory Visit: Payer: Self-pay | Admitting: Emergency Medicine

## 2021-07-11 DIAGNOSIS — R911 Solitary pulmonary nodule: Secondary | ICD-10-CM

## 2024-02-11 ENCOUNTER — Other Ambulatory Visit: Payer: Self-pay | Admitting: General Surgery

## 2024-02-11 DIAGNOSIS — R221 Localized swelling, mass and lump, neck: Secondary | ICD-10-CM

## 2024-02-18 ENCOUNTER — Ambulatory Visit
Admission: RE | Admit: 2024-02-18 | Discharge: 2024-02-18 | Disposition: A | Source: Ambulatory Visit | Attending: General Surgery

## 2024-02-18 DIAGNOSIS — R221 Localized swelling, mass and lump, neck: Secondary | ICD-10-CM

## 2024-02-20 ENCOUNTER — Other Ambulatory Visit: Payer: Self-pay | Admitting: General Surgery

## 2024-02-20 DIAGNOSIS — R221 Localized swelling, mass and lump, neck: Secondary | ICD-10-CM

## 2024-02-25 ENCOUNTER — Ambulatory Visit
Admission: RE | Admit: 2024-02-25 | Discharge: 2024-02-25 | Disposition: A | Discharge status: Home / Self Care | Source: Ambulatory Visit | Attending: General Surgery | Admitting: General Surgery

## 2024-02-25 DIAGNOSIS — R221 Localized swelling, mass and lump, neck: Secondary | ICD-10-CM

## 2024-02-25 MED ORDER — IOPAMIDOL (ISOVUE-300) INJECTION 61%
75.0000 mL | Freq: Once | INTRAVENOUS | Status: AC | PRN
  Administered 2024-02-25: 75 mL via INTRAVENOUS

## 2024-03-05 ENCOUNTER — Encounter: Payer: Self-pay | Admitting: Medical Oncology

## 2024-03-10 ENCOUNTER — Inpatient Hospital Stay: Admitting: Physician Assistant

## 2024-03-10 ENCOUNTER — Inpatient Hospital Stay: Attending: Physician Assistant

## 2024-03-10 ENCOUNTER — Encounter: Payer: Self-pay | Admitting: Medical Oncology

## 2024-03-10 VITALS — BP 135/93 | HR 69 | Temp 97.3°F | Resp 20 | Ht 71.0 in | Wt 198.7 lb

## 2024-03-10 DIAGNOSIS — R9389 Abnormal findings on diagnostic imaging of other specified body structures: Secondary | ICD-10-CM

## 2024-03-10 DIAGNOSIS — R221 Localized swelling, mass and lump, neck: Secondary | ICD-10-CM | POA: Insufficient documentation

## 2024-03-10 DIAGNOSIS — N400 Enlarged prostate without lower urinary tract symptoms: Secondary | ICD-10-CM | POA: Insufficient documentation

## 2024-03-10 LAB — CMP (CANCER CENTER ONLY)
ALT: 15 U/L (ref 0–44)
AST: 19 U/L (ref 15–41)
Albumin: 4.4 g/dL (ref 3.5–5.0)
Alkaline Phosphatase: 55 U/L (ref 38–126)
Anion gap: 8 (ref 5–15)
BUN: 12 mg/dL (ref 8–23)
CO2: 30 mmol/L (ref 22–32)
Calcium: 9.5 mg/dL (ref 8.9–10.3)
Chloride: 104 mmol/L (ref 98–111)
Creatinine: 1.33 mg/dL — ABNORMAL HIGH (ref 0.61–1.24)
GFR, Estimated: 57 mL/min — ABNORMAL LOW (ref >60)
Glucose, Bld: 95 mg/dL (ref 70–99)
Potassium: 4.3 mmol/L (ref 3.5–5.1)
Sodium: 142 mmol/L (ref 135–145)
Total Bilirubin: 0.7 mg/dL (ref 0.0–1.2)
Total Protein: 7.2 g/dL (ref 6.5–8.1)

## 2024-03-10 LAB — CBC WITH DIFFERENTIAL (CANCER CENTER ONLY)
Abs Immature Granulocytes: 0.01 K/uL (ref 0.00–0.07)
Basophils Absolute: 0.1 K/uL (ref 0.0–0.1)
Basophils Relative: 2 %
Eosinophils Absolute: 0 K/uL (ref 0.0–0.5)
Eosinophils Relative: 1 %
HCT: 45.4 % (ref 39.0–52.0)
Hemoglobin: 15.5 g/dL (ref 13.0–17.0)
Immature Granulocytes: 0 %
Lymphocytes Relative: 36 %
Lymphs Abs: 1.2 K/uL (ref 0.7–4.0)
MCH: 31.3 pg (ref 26.0–34.0)
MCHC: 34.1 g/dL (ref 30.0–36.0)
MCV: 91.7 fL (ref 80.0–100.0)
Monocytes Absolute: 0.5 K/uL (ref 0.1–1.0)
Monocytes Relative: 14 %
Neutro Abs: 1.6 K/uL — ABNORMAL LOW (ref 1.7–7.7)
Neutrophils Relative %: 47 %
Platelet Count: 176 K/uL (ref 150–400)
RBC: 4.95 MIL/uL (ref 4.22–5.81)
RDW: 13.6 % (ref 11.5–15.5)
WBC Count: 3.4 K/uL — ABNORMAL LOW (ref 4.0–10.5)
nRBC: 0 % (ref 0.0–0.2)

## 2024-03-10 NOTE — Progress Notes (Unsigned)
 [facility] Telephone:([phone]   Fax:([phone]) [phone]  INITIAL CONSULTATION:  Patient Care Team: Patient, No Pcp Per as PCP - General (General Practice) Leonetti, Forestine BROCKS, RN as Oncology Nurse Navigator (Medical Oncology)  CHIEF COMPLAINTS/PURPOSE OF CONSULTATION:  Neck mass   HISTORY OF PRESENTING ILLNESS:  Ralph FORBES Dec Sr. 73 y.o. male with medical history significant for inguinal hernia and spondylolisthesis L4- L5 level.  On review of the previous records patient is being referred by PCP Dr. Talitha Repress at [facility].  Patient US  soft tissue neck on 02/18/24 showing hypoechoic right submandibular mass, ovoid and well circumscribed with internal vascularity, measuring up to 3.4 x 2.0 x 3.0 cm, suspicious for a pathologic lymph node. CT soft tissue neck subsequently performed on 02/25/24 showing right neck marked area of clinical concern overlying a pathologically enlarged solid right level 2a lymph node which is 22 x 29 x 38 mm in certain dimensions. Smaller but asymmetrically enlarged additional right level II and right level II / level III junction lymph nodes are present. 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  On exam today patient is accompanied by spouse who provides additional history. Approximately six weeks ago, he noted a painless, non-tender mass under his right jaw while shaving. He denied pain, dysphagia, odynophagia, changes in taste, or constitutional symptoms. He initially went to PCP for a rash on his right upper arm thought to be poison sumac. At that appointment he mentioned the new swelling he noticed on his neck.  He has not experienced fevers, chills, night sweats, or additional lymphadenopathy.  He denied recent infections, upper respiratory symptoms, or antibiotic use. He described a self-limited gastrointestinal illness one month ago, now resolved. He does not use  tobacco and has no history of hypertension, diabetes, or asthma. He is a never smoker and rarely drinks alcohol. 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  MEDICAL HISTORY:  Past Medical History:  Diagnosis Date   Inguinal hernia 12/01/2018    SURGICAL HISTORY: Past Surgical History:  Procedure Laterality Date   INGUINAL HERNIA REPAIR Bilateral 12/01/2018   Procedure: LAPAROSCOPIC BILATERAL INGUINAL HERNIA REPAIR WITH MESH;  Surgeon: Gail Favorite, MD;  Location: [facility];  Service: General;  Laterality: Bilateral;    SOCIAL HISTORY: Social History   Socioeconomic History   Marital status: Married    Spouse name: Not on file   Number of children: Not on file   Years of education: Not on file   Highest education level: Not on file  Occupational History   Not on file  Tobacco Use   Smoking status: Never   Smokeless tobacco: Never  Vaping Use   Vaping status: Never Used  Substance and Sexual Activity   Alcohol use: Not Currently    Alcohol/week: 1.0 standard drink of alcohol    Types: 1 Glasses of wine per week    Comment: rare   Drug use: Never   Sexual activity: Not on file  Other Topics Concern   Not on file  Social History Narrative   Not on file   Social Drivers of Health   Financial Resource Strain: Not on file  Food Insecurity: No Food Insecurity (03/09/2024)   Hunger Vital Sign    Worried About Running Out of Food in the Last Year: Never true    Ran Out of Food in the Last Year: Never true  Transportation Needs: No  Transportation Needs (03/09/2024)   PRAPARE - Administrator, Civil Service (Medical): No    Lack of Transportation (Non-Medical): No  Physical Activity: Not on file  Stress: Not on file  Social Connections: Unknown (08/14/2021)   Received from [facility]   Social Network    Social Network: Not on file  Intimate Partner Violence: Unknown (07/05/2021)   Received from  [facility]   HITS    Physically Hurt: Not on file    Insult or Talk Down To: Not on file    Threaten Physical Harm: Not on file    Scream or Curse: Not on file    FAMILY HISTORY: No family history on file.  ALLERGIES:  has no known allergies.  MEDICATIONS:  Current Outpatient Medications  Medication Sig Dispense Refill   diazepam  (VALIUM ) 5 MG tablet Take 1-2 tablets (5-10 mg total) by mouth every 6 (six) hours as needed for muscle spasms. 30 tablet 0   HYDROcodone -acetaminophen  (NORCO) 10-325 MG tablet Take 1-2 tablets by mouth every 4 (four) hours as needed for moderate pain ((score 4 to 6)). 50 tablet 0   No current facility-administered medications for this visit.    REVIEW OF SYSTEMS:   All other systems are reviewed and are negative for acute change except as noted in the HPI.  PHYSICAL EXAMINATION: ECOG PERFORMANCE STATUS: 0 - Asymptomatic  Vitals:   03/10/24 1020  BP: (!) 135/93  Pulse: 69  Resp: 20  Temp: (!) 97.3 F (36.3 C)  SpO2: 99%   Filed Weights   03/10/24 1020  Weight: 198 lb 11.2 oz (90.1 kg)    Physical Exam Constitutional:      Appearance: He is not ill-appearing or toxic-appearing.  HENT:     Head: Normocephalic.     Right Ear: External ear normal.     Left Ear: External ear normal.     Nose: Nose normal.     Mouth/Throat:     Mouth: Mucous membranes are moist.  Eyes:     General: No scleral icterus.    Conjunctiva/sclera: Conjunctivae normal.  Neck:     Comments: Right submandibular mobile mass approx 2 x 1 cm. Non tender. Cardiovascular:     Rate and Rhythm: Normal rate and regular rhythm.     Pulses: Normal pulses.     Heart sounds: Normal heart sounds.  Pulmonary:     Effort: Pulmonary effort is normal.     Breath sounds: Normal breath sounds.  Abdominal:     General: There is no distension.  Musculoskeletal:        General: Normal range of motion.     Cervical back: Normal range of motion.  Skin:    General: Skin  is warm and dry.  Neurological:     Mental Status: He is alert.  Psychiatric:        Mood and Affect: Mood normal.       LABORATORY DATA:  I have reviewed the data as listed    Latest Ref Rng & Units 01/25/2019    8:52 AM 11/24/2018    3:08 PM 10/18/2018    3:57 PM  CBC  WBC 4.0 - 10.5 K/uL 3.9  4.0  3.8   Hemoglobin 13.0 - 17.0 g/dL 83.5  84.4  84.0   Hematocrit 39.0 - 52.0 % 49.2  47.3  48.0   Platelets 150 - 400 K/uL 210  197  218        Latest Ref Rng &  Units 11/24/2018    3:08 PM 10/18/2018    3:57 PM  CMP  Glucose 70 - 99 mg/dL 87  92   BUN 8 - 23 mg/dL 15  12   Creatinine 9.38 - 1.24 mg/dL 8.84  8.70   Sodium 864 - 145 mmol/L 141  140   Potassium 3.5 - 5.1 mmol/L 3.9  3.8   Chloride 98 - 111 mmol/L 105  107   CO2 22 - 32 mmol/L 29  25   Calcium 8.9 - 10.3 mg/dL 9.0  9.2   Total Protein 6.5 - 8.1 g/dL 6.9    Total Bilirubin 0.3 - 1.2 mg/dL 0.9    Alkaline Phos 38 - 126 U/L 46    AST 15 - 41 U/L 13    ALT 0 - 44 U/L 15       RADIOGRAPHIC STUDIES: I have personally reviewed the radiological images as listed and agreed with the findings in the report. CT SOFT TISSUE NECK W CONTRAST Result Date: 03/04/2024 EXAM: CT NECK WITH CONTRAST 02/25/2024 02:23:55 PM TECHNIQUE: CT of the neck was performed with the administration of 75 mL of Isovue  300 intravenous contrast. Multiplanar reformatted images are provided for review. Automated exposure control, iterative reconstruction, and/or weight based adjustment of the mA/kV was utilized to reduce the radiation dose to as low as reasonably achievable. COMPARISON: Neck ultrasound 02/18/2024. CLINICAL HISTORY: 73 year old male knot noticeable to RT side neck under jaw area x 3 weeks Abnormal Ultrasound recently. FINDINGS: AERODIGESTIVE TRACT: No laryngeal mass or hyperenhancement. No significant pharyngeal hyperenhancement, but there is asymmetric and masslike soft tissue of the right tonsillar pillar best demonstrated on coronal  image 66, some margins are indistinct, but the lesion is approximately 15 x 15 x 22 mm. Other pharyngeal soft tissue contours are within normal limits. The parapharyngeal and retropharyngeal spaces are negative (note a retropharyngeal course of both internal carotid arteries on series 3 image 40, normal variant). SALIVARY GLANDS: The parotid and submandibular glands are unremarkable. THYROID : Unremarkable. LYMPH NODES: Right neck marked area of clinical concern on series 3 image 47 is overlying a pathologically enlarged solid right level 2a lymph node which is 22 x 29 x 38 mm in certain dimensions. Smaller but asymmetrically enlarged additional right level II and right level II / level III junction lymph nodes are present (sagittal image 21). No cystic or necrotic lymph nodes. No contralateral lymphadenopathy. Right level 1, level 4 and level 5 lymph nodes remain within normal limits. BRAIN, ORBITS, SINUSES AND MASTOIDS: Visible paranasal sinuses, tympanic cavities and mastoids are well aerated. No acute abnormality. LUNGS AND MEDIASTINUM: Calcified atherosclerosis of the aortic arch. Otherwise negative visible superior mediastinum. Negative visible lung apices. BONES: Scattered bilateral dental caries. Chronic cervical spine degeneration. No acute or suspicious osseous lesion. VASCULATURE: Major vascular structures in the bilateral neck and chest the skull bases are enhancing and patent. Both cervical internal carotid arteries are tortuous and have a partially retropharyngeal course. Right vertebral artery appears dominant, normal variant. No significant atherosclerosis. Calcified atherosclerosis of the aortic arch. IMPRESSION: 1. 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  Electronically signed by: Ralph Hurst MD 03/04/2024 04:16 AM EST RP Workstation: [id number]   US  SOFT TISSUE HEAD & NECK (NON-THYROID ) Result Date: 02/20/2024 EXAM: US  Right Side of  Neck Nonvascular Soft Tissue Ultrasound 02/18/2024 01:56:58 PM TECHNIQUE: Real-time ultrasound scan of the Right Side of Neck with image documentation. COMPARISON: None available. CLINICAL HISTORY: Mass of right side of neck, lymph node. FINDINGS: SOFT TISSUES: Hypoechoic right submandibular mass, ovoid and well circumscribed with internal vascularity, measuring up to 3.4 x 2.0 x 3.0 cm. This is suspicious for a pathologic lymph node. CT of the neck with contrast is recommended for further characterization. No free fluid or fluid collection. IMPRESSION: 1. Hypoechoic right submandibular mass, ovoid and well circumscribed with internal vascularity, measuring up to 3.4 x 2.0 x 3.0 cm, suspicious for a pathologic lymph node. CT of the neck with contrast is recommended for further characterization. Electronically signed by: Ryan Chess MD 02/20/2024 12:02 PM EST RP Workstation: [id number]    ASSESSMENT & PLAN Ralph IMPERATO Sr. is a 73 y.o. male presenting to [facility] for consultation regarding neck mass. Patient will proceed with laboratory workup today.   #Neck mass - Reviewed US  and CT neck imaging with patient. Discussed imaging is concerning for a right tonsillar pillar primary pharyngeal neoplasm. - Will check labs today including CBC and CMP. Will order PET scan to evaluate for metastatic disease. - Patient scheduled with ENT Dr. Tobie tomorrow for consult. -Patient will RTC when work up is complete.  Patient expressed understanding of the recommended workup and is agreeable to move forward.   All questions were answered. The patient knows to call the clinic with any problems, questions or concerns.  Shared visit with Dr. Autumn.  Orders Placed This Encounter  Procedures   NM PET Image  Initial (PI) Skull Base To Thigh    [facility]    Standing Status:   Future    Expected Date:   03/11/2024    Expiration Date:   03/10/2025    If indicated for the ordered procedure, I authorize the administration of a radiopharmaceutical per Radiology protocol:   Yes    Preferred imaging location?:   [facility]   CBC with Differential (Cancer Center Only)    Standing Status:   Future    Expected Date:   03/10/2024    Expiration Date:   03/10/2025   CMP (Cancer Center only)    Standing Status:   Future    Expected Date:   03/10/2024    Expiration Date:   03/10/2025      I have spent a total of 60 minutes minutes of face-to-face and non-face-to-face time, preparing to see the patient, obtaining and/or reviewing separately obtained history, performing a medically appropriate examination, counseling and educating the patient, ordering medications/tests/procedures, referring and communicating with other health care professionals, documenting clinical information in the electronic health record, independently interpreting results and communicating results to the patient, and care coordination.   Cormac Wint Walisiewicz PA-C Department of Hematology/Oncology [facility] at [facility] Phone: [phone]

## 2024-03-10 NOTE — Progress Notes (Signed)
 [facility]  Patient presented to clinic, with spouse, for his scheduled appointment with DEVONNA Gift. I introduced myself and provided them with my direct contact information. Patient and spouse were both encouraged to call me with any questions/concerns they may have.  Scheduled patient with ENT, Dr. Tobie for 03/11/2024 at 3:15 PM. Patient aware of appointment and confirms the time. Address and ENT office number provided.  Colene KYM Raider, RN, BSN Oncology Nurse Navigator, [facility] 03/10/2024 11:37 AM

## 2024-03-10 NOTE — Progress Notes (Signed)
 Patient returned call and to come to appointment today at 10:00. PA-C Mallie informed and confirmed time works. Patient thanked for his patience and understanding.   Colene KYM Raider, RN, BSN Oncology Nurse Navigator, [facility] 03/10/2024 9:15 AM

## 2024-03-10 NOTE — Progress Notes (Signed)
 [facility]  LVM with patient informing him we need to change his appt time to earlier today or Friday 12/12. Requested for patient to return call to make the appointment change. Call back number provided.   Colene KYM Raider, RN, BSN Oncology Nurse Navigator, [facility] 03/10/2024 9:00 AM

## 2024-03-10 NOTE — Patient Instructions (Addendum)
 [facility] Office Visit Discharge Information and Instructions  Thank you for choosing [facility] for your healthcare needs.  Below is a summary of today's discussion, along with our contact information and an outline of what to expect next.  Reason for Visit:  neck mass  Proposed Diagnostic Care Plan: Labs collected today PET CT scan ordered. You will be contacted by our scheduler Tonya. Colene will contact ENT to help schedule your appointment. The appointment will show up in your MyChart for [facility].  The ENT practice is: [facility] [address] [place],  [place]  [address] Phone number is [phone]   What to Expect: - Generally, when lab tests are ordered the results can take up to 1 week for results to be available.  At that point, we will contact you to discuss your results with you.  Unless there is a critical result, we will typically wait for all of your lab results to be available before contacting you. - If a biopsy is part of your Care Plan, those results can take on average 7-10 days to result.  Once results are available, we will contact you to discuss your pathology results and any next steps. - If you have additional imaging ordered, such as a CT Scan, MRI, Ultrasound, Bone Scan, or PET scan, your imaging will need to be authorized then scheduled with the earliest available appointment.  You may be asked to travel to another hospital within [facility] who has a sooner availability, please consider doing so if asked. - If you use MyChart, your results will be available to you in the MyChart portal.  Your provider will be in touch with you as soon as all of your results are available to be discussed.  Your Diagnostic Clinic Provider:  Addalynn Kumari Walisiewicz PA-C and Dr. Autumn. Your Diagnostic Navigator:  Colene Raider RN, office number [phone]  If you or your caregiver have number blocking on your cell phones, please  ensure the cancer center's numbers are not blocked.  If you are not a registered MyChart user, please consider enrolling in MyChart to receive your test results and visit notes.  You can also access your discharge instructions electronically.  MyChart also gives you an electronic means to communicate with your Care Team instead of needing to call in to the cancer center.  We appreciate you trusting us  with your healthcare and look forward to partnering with you as we work to uncover what your potential diagnosis may be.  Please do not hesitate to reach out at any point with questions or concerns.

## 2024-03-11 ENCOUNTER — Encounter (INDEPENDENT_AMBULATORY_CARE_PROVIDER_SITE_OTHER): Payer: Self-pay | Admitting: Otolaryngology

## 2024-03-11 ENCOUNTER — Other Ambulatory Visit (HOSPITAL_COMMUNITY)
Admission: RE | Admit: 2024-03-11 | Discharge: 2024-03-11 | Disposition: A | Source: Ambulatory Visit | Attending: Otolaryngology | Admitting: Otolaryngology

## 2024-03-11 ENCOUNTER — Ambulatory Visit (INDEPENDENT_AMBULATORY_CARE_PROVIDER_SITE_OTHER): Admitting: Otolaryngology

## 2024-03-11 VITALS — BP 146/87 | HR 73 | Ht 71.0 in | Wt 198.0 lb

## 2024-03-11 DIAGNOSIS — J358 Other chronic diseases of tonsils and adenoids: Secondary | ICD-10-CM | POA: Insufficient documentation

## 2024-03-11 DIAGNOSIS — R221 Localized swelling, mass and lump, neck: Secondary | ICD-10-CM

## 2024-03-11 DIAGNOSIS — J392 Other diseases of pharynx: Secondary | ICD-10-CM | POA: Diagnosis not present

## 2024-03-11 MED ORDER — OXYCODONE HCL 5 MG PO TABS: 5.0000 mg | ORAL_TABLET | ORAL | 0 refills | Status: AC | PRN

## 2024-03-11 NOTE — Progress Notes (Signed)
 Dear Dr. Austin, Here is my assessment for our mutual patient, Ralph Franklin. Thank you for allowing me the opportunity to care for your patient. Please do not hesitate to contact me should you have any other questions. Sincerely, Dr. Eldora Blanch  Otolaryngology Clinic Note Referring provider: Dr. Austin HPI:  Ralph Franklin is a 73 y.o. male kindly referred by Dr. Austin for evaluation of right neck mass  Initial visit (03/2024): Patient reports: incidentally noted right neck mass while he was shaving. He feels like it waxes and wanes. No pain. Able to turn his neck without out. Accompanied by wife. Patient otherwise denies: - dysphagia, odynophagia, unintentional weight loss - changes in voice, shortness of breath, hemoptysis - ear pain  No skin cancers prior. Quite functional  ENT Surgery: no Personal or FHx of bleeding dz or anesthesia difficulty: no  AP/AC: no  Tobacco: no  PMHx: Back Pain  Independent Review of Additional Tests or Records:  CMP and CBC 03/10/2024: WBC 3.4, BUN/Cr 12/1.33 (baseline ~1.1-1.3) [facility] (03/04/2024) Referral notes reviewed and uploaded or available in chart in media tab - noted right neck mass, ref to ENT CT Neck 02/25/2024 independently interpreted: likely right tonsil asymmetry/going towards GT sulcus with multiple right neck lymph nodes level 2 and 3. No necrotic lymph nodes; unable to appreciate any other obvious asymmetry on imaging Carolyn Combes (03/10/2024): noted painless right neck mas, no other sx; never smoker; Dx: Neck mass, Rx: ref to ENT, order PET PMH/Meds/All/SocHx/FamHx/ROS:   Past Medical History:  Diagnosis Date   Inguinal hernia 12/01/2018     Past Surgical History:  Procedure Laterality Date   INGUINAL HERNIA REPAIR Bilateral 12/01/2018   Procedure: LAPAROSCOPIC BILATERAL INGUINAL HERNIA REPAIR WITH MESH;  Surgeon: Gail Favorite, MD;  Location: [facility];  Service: General;  Laterality: Bilateral;    History  reviewed. No pertinent family history.   Social Connections: Unknown (08/14/2021)   Received from [facility]   Social Network    Social Network: Not on file     Current Medications[1]   Physical Exam:   BP (!) 146/87 (BP Location: Left Arm, Patient Position: Sitting, Cuff Size: Large)   Pulse 73   Ht 5' 11 (1.803 m)   Wt 198 lb (89.8 kg)   SpO2 96%   BMI 27.62 kg/m   Salient findings:  CN II-XII intact Bilateral EAC clear and TM intact with well pneumatized middle ear spaces Anterior rhinoscopy: Septum intact; bilateral inferior turbinates without significant hypertrophy No lesions of oral cavity/oropharynx - EXCEPT firm right tonsillar mass, not tender; no other masses appreciated, no palate involvement Right neck level 2 soft mass, very mobile, small other level 3 LN appreciated No respiratory distress or stridor; easily tolerates secretions; TFL was indicated to better evaluate the proximal airway, given the patient's history and exam findings, and is detailed below.   Seprately Identifiable Procedures:  Prior to initiating any procedures, risks/benefits/alternatives were explained to the patient and verbal consent obtained. Procedure Note Pre-procedure diagnosis: Right neck mass, rule out secondary lesion Post-procedure diagnosis: Same Procedure: Transnasal Fiberoptic Laryngoscopy, CPT 31575 - Mod 25 Indication: see above Complications: None apparent EBL: 0 mL  The procedure was undertaken to further evaluate the patient's complaint above, with mirror exam inadequate for appropriate examination due to gag reflex and poor patient tolerance  Procedure:  Patient was identified as correct patient. Verbal consent was obtained. The nose was sprayed with oxymetazoline and 4% lidocaine . The The flexible laryngoscope was passed through the nose  to view the nasal cavity, pharynx (oropharynx, hypopharynx) and larynx.  The larynx was examined at rest and during multiple phonatory  tasks. Documentation was obtained and reviewed with patient. The scope was removed. The patient tolerated the procedure well.  Findings: The nasal cavity and nasopharynx did not reveal any masses or lesions, mucosa appeared to be without obvious lesions. The tongue base, pharyngeal walls, piriform sinuses, vallecula, epiglottis and postcricoid region are normal in appearance EXCEPT: right tonsil exophytic mass but does not involve the epiglottis or palate --- seems quite confined to tonsillar area. The visualized portion of the subglottis and proximal trachea is widely patent. The vocal folds are mobile bilaterally. There are no lesions on the free edge of the vocal folds nor elsewhere in the larynx worrisome for malignancy.    Electronically signed by: Eldora KATHEE Blanch, MD 03/11/2024 3:44 PM  Procedure Note - Punch Biopsy  Name: Ralph FORBES Dec Sr. MRN: [id number] Date: 03/11/2024   Pre-procedure diagnosis: Right Tonsil Lesion, concern for malignancy Post-procedure diagnosis: Same Procedure: Biopsy of Right Tonsil/Oropharynx Lesion - CPT 42800 RT Complications: None apparent EBL: <5 mL Date: 03/11/2024  Indication: see above  Risks and benefits of biopsy were explained to the patient, written consent was obtained.   The patient was identified as the correct patient.  The area surrounding the lesion was injected 1% Lidocaine  with 1:100,000 epinephrine  and allowed to work. A 4mm punch biopsy was used to make a circular incision in the periphery of the lesion. The tissue was carefully picked up with a forcep and cut free with sharp scissors -- several biopsies were obtained in this manner. The specimens were placed in saline and passed off the field. Wound was hemostatic. The patient tolerated the procedure well.    Electronically signed by: Eldora KATHEE Blanch, MD 03/11/2024 3:44 PM    Impression & Plans:  Ralph Franklin is a 73 y.o. male with  1. Neck mass   2. 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 Biopsy done. Has upcoming PET He wishes for excision/surgical removal Will f/u in 10 days (after PET done) to discuss further steps Oxy given for breakthrough pain  See below regarding exact medications prescribed this encounter including dosages and route: Meds ordered this encounter  Medications   oxyCODONE  (ROXICODONE ) 5 MG immediate release tablet    Sig: Take 1 tablet (5 mg total) by mouth every 4 (four) hours as needed for severe pain (pain score 7-10).    Dispense:  15 tablet    Refill:  0      Thank you for allowing me the opportunity to care for your patient. Please do not hesitate to contact me should you have any other questions.  Sincerely, Eldora Blanch, MD Otolaryngologist (ENT), [facility] Phone: [phone] Fax: [phone]  03/11/2024, 3:44 PM   MDM: I have personally spent 63 minutes involved in face-to-face and non-face-to-face activities for this patient on the day of the visit.  Professional time spent excludes any procedures performed but includes the following activities, in addition to those noted in the documentation: preparing to see the patient (review of outside documentation and results), performing a medically appropriate examination, counseling, ordering medications (oxycodone ), documenting in the electronic health record, independently interpreting results (CT).       [1]  Current Outpatient Medications:    diazepam  (VALIUM ) 5 MG tablet, Take 1-2 tablets (5-10 mg total) by mouth every 6 (six) hours  as needed for muscle spasms., Disp: 30 tablet, Rfl: 0   HYDROcodone -acetaminophen  (NORCO) 10-325 MG tablet, Take 1-2 tablets by mouth every 4 (four) hours as needed for moderate pain ((score 4 to 6))., Disp: 50 tablet, Rfl: 0   oxyCODONE  (ROXICODONE ) 5 MG immediate release tablet, Take 1 tablet (5 mg total) by mouth every 4 (four) hours as needed for severe pain (pain score  7-10)., Disp: 15 tablet, Rfl: 0

## 2024-03-12 ENCOUNTER — Encounter: Payer: Self-pay | Admitting: Physician Assistant

## 2024-03-16 LAB — SURGICAL PATHOLOGY

## 2024-03-17 ENCOUNTER — Telehealth: Payer: Self-pay | Admitting: Physician Assistant

## 2024-03-17 DIAGNOSIS — C099 Malignant neoplasm of tonsil, unspecified: Secondary | ICD-10-CM

## 2024-03-17 NOTE — Telephone Encounter (Signed)
 I spoke to Mr. Ralph Franklin to review the tonsil biopsy results from 03/11/2024. Pathology confirmed SCC p16 positive. Dr. Autumn will see patient back on 12/24 at 11 am. We will send referral to radiation oncology with Dr. Izell. Patient has staging PET scan this Friday at 03/19/2024 and follow up with Dr. Tobie, ENT on 03/22/2024.  Ralph Franklin expressed understanding of the plan provided.

## 2024-03-18 ENCOUNTER — Encounter: Payer: Self-pay | Admitting: Physician Assistant

## 2024-03-18 ENCOUNTER — Telehealth: Payer: Self-pay | Admitting: Oncology

## 2024-03-18 ENCOUNTER — Encounter: Payer: Self-pay | Admitting: Medical Oncology

## 2024-03-18 NOTE — Telephone Encounter (Signed)
 I was unable to leave voicemail for patient so I mailed an appointment reminder for 03/24/2024.

## 2024-03-19 ENCOUNTER — Encounter (HOSPITAL_COMMUNITY)
Admission: RE | Admit: 2024-03-19 | Discharge: 2024-03-19 | Disposition: A | Discharge status: Home / Self Care | Source: Ambulatory Visit | Attending: Physician Assistant | Admitting: Physician Assistant

## 2024-03-19 DIAGNOSIS — R9389 Abnormal findings on diagnostic imaging of other specified body structures: Secondary | ICD-10-CM | POA: Insufficient documentation

## 2024-03-19 DIAGNOSIS — N400 Enlarged prostate without lower urinary tract symptoms: Secondary | ICD-10-CM | POA: Diagnosis not present

## 2024-03-19 DIAGNOSIS — R590 Localized enlarged lymph nodes: Secondary | ICD-10-CM | POA: Insufficient documentation

## 2024-03-19 DIAGNOSIS — R221 Localized swelling, mass and lump, neck: Secondary | ICD-10-CM | POA: Diagnosis present

## 2024-03-19 LAB — GLUCOSE, CAPILLARY: Glucose-Capillary: 85 mg/dL (ref 70–99)

## 2024-03-19 MED ORDER — FLUDEOXYGLUCOSE F - 18 (FDG) INJECTION
9.8800 | Freq: Once | INTRAVENOUS | Status: AC
  Administered 2024-03-19: 9.88 via INTRAVENOUS

## 2024-03-19 NOTE — Telephone Encounter (Signed)
 12/19 @ 3:27 pm Left voicemail for patient to call our office to be sch for consult.

## 2024-03-22 ENCOUNTER — Encounter (INDEPENDENT_AMBULATORY_CARE_PROVIDER_SITE_OTHER): Payer: Self-pay | Admitting: Otolaryngology

## 2024-03-22 ENCOUNTER — Ambulatory Visit (INDEPENDENT_AMBULATORY_CARE_PROVIDER_SITE_OTHER): Admitting: Otolaryngology

## 2024-03-22 VITALS — BP 129/84 | HR 61 | Ht 71.0 in | Wt 198.0 lb

## 2024-03-22 DIAGNOSIS — C099 Malignant neoplasm of tonsil, unspecified: Secondary | ICD-10-CM | POA: Diagnosis not present

## 2024-03-22 NOTE — Progress Notes (Signed)
 Otolaryngology Clinic Note HPI:  Ralph Franklin is a 73 y.o. male kindly referred by Dr. No ref. provider found for evaluation of right neck mass  Initial visit (03/2024): Patient reports: incidentally noted right neck mass while he was shaving. He feels like it waxes and wanes. No pain. Able to turn his neck without out. Accompanied by wife. Patient otherwise denies: - dysphagia, odynophagia, unintentional weight loss - changes in voice, shortness of breath, hemoptysis - ear pain  No skin cancers prior. Quite functional  --------------------------------------------------------- 03/22/2024 Seen in follow up. He is otherwise doing well from symptom standpoint; Neck mass has not grown. We discussed his options.    ENT Surgery: no Personal or FHx of bleeding dz or anesthesia difficulty: no  AP/AC: no  Tobacco: no  PMHx: Back Pain  Independent Review of Additional Tests or Records:  CMP and CBC 03/10/2024: WBC 3.4, BUN/Cr 12/1.33 (baseline ~1.1-1.3) [facility] (03/04/2024) Referral notes reviewed and uploaded or available in chart in media tab - noted right neck mass, ref to ENT CT Neck 02/25/2024 independently interpreted: likely right tonsil asymmetry/going towards GT sulcus with multiple right neck lymph nodes level 2 and 3. No necrotic lymph nodes; unable to appreciate any other obvious asymmetry on imaging Carolyn Combes (03/10/2024): noted painless right neck mas, no other sx; never smoker; Dx: Neck mass, Rx: ref to ENT, order PET Path Bx (03/2024): p16+ SCCa tonsil PET CT (03/19/2024): PET avid right tonsil mass, with right neck avidity from LN, no distant mets appreciated  PMH/Meds/All/SocHx/FamHx/ROS:   Past Medical History:  Diagnosis Date   Inguinal hernia 12/01/2018     Past Surgical History:  Procedure Laterality Date   INGUINAL HERNIA REPAIR Bilateral 12/01/2018   Procedure: LAPAROSCOPIC BILATERAL INGUINAL HERNIA REPAIR WITH MESH;  Surgeon: Gail Favorite, MD;  Location: [facility];  Service: General;  Laterality: Bilateral;    History reviewed. No pertinent family history.   Social Connections: Unknown (08/14/2021)   Received from [facility]   Social Network    Social Network: Not on file     Current Medications[1]   Physical Exam:   BP 129/84 (BP Location: Left Arm, Patient Position: Sitting, Cuff Size: Large)   Pulse 61   Ht 5' 11 (1.803 m)   Wt 198 lb (89.8 kg)   SpO2 97%   BMI 27.62 kg/m   Salient findings:  CN II-XII intact Anterior rhinoscopy: Septum intact; bilateral inferior turbinates without significant hypertrophy No lesions of oral cavity/oropharynx - EXCEPT firm right tonsillar mass, not tender, appears mobile and exophytic; no other masses appreciated, no palate involvement Right neck level 2 soft mass, very mobile, small other level 3 LN appreciated No respiratory distress or stridor; easily tolerates secretions;   Seprately Identifiable Procedures:  Prior to initiating any procedures, risks/benefits/alternatives were explained to the patient and verbal consent obtained. Procedure Note (Prior, not today) Pre-procedure diagnosis: Right neck mass, rule out secondary lesion Post-procedure diagnosis: Same Procedure: Transnasal Fiberoptic Laryngoscopy, CPT 31575 - Mod 25 Indication: see above Complications: None apparent EBL: 0 mL  The procedure was undertaken to further evaluate the patient's complaint above, with mirror exam inadequate for appropriate examination due to gag reflex and poor patient tolerance  Procedure:  Patient was identified as correct patient. Verbal consent was obtained. The nose was sprayed with oxymetazoline and 4% lidocaine . The The flexible laryngoscope was passed through the nose to view the nasal cavity, pharynx (oropharynx, hypopharynx) and larynx.  The larynx was examined at rest and during  multiple phonatory tasks. Documentation was obtained and reviewed with patient. The scope  was removed. The patient tolerated the procedure well.  Findings: The nasal cavity and nasopharynx did not reveal any masses or lesions, mucosa appeared to be without obvious lesions. The tongue base, pharyngeal walls, piriform sinuses, vallecula, epiglottis and postcricoid region are normal in appearance EXCEPT: right tonsil exophytic mass but does not involve the epiglottis or palate --- seems quite confined to tonsillar area. The visualized portion of the subglottis and proximal trachea is widely patent. The vocal folds are mobile bilaterally. There are no lesions on the free edge of the vocal folds nor elsewhere in the larynx worrisome for malignancy.    Electronically signed by: Eldora KATHEE Blanch, MD 03/24/2024 9:27 AM   Electronically signed by: Eldora KATHEE Blanch, MD 03/24/2024 9:27 AM    Impression & Plans:  Ralph Franklin is a 73 y.o. 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 Primary CRT v/s RT --- to avoid chemo, may consider ND in this case.   He is considering his options but strongly wishes to have surgery for now. I advised he should meet with Dr. Izell and Pasam and get all the details before he makes a decision Will request TB addn Will call in about 10 days to see how he'd like to proceed   See below regarding exact medications prescribed this encounter including dosages and route: No orders of the defined types were placed in this encounter.     Thank you for allowing me the opportunity to care for your patient. Please do not hesitate to contact me should you have any other questions.  Sincerely, Eldora Blanch,  MD Otolaryngologist (ENT), [facility] Phone: [phone] Fax: [phone]  03/24/2024, 9:27 AM   MDM: I have personally spent 45 minutes involved in face-to-face and non-face-to-face activities for this patient on the day of the visit.  Professional time spent excludes any procedures performed but includes the following activities, in addition to those noted in the documentation: preparing to see the patient (review of outside documentation and results), performing a medically appropriate examination, extensive counseling, documenting in the electronic health record, independently interpreting results (PET).       [1]  Current Outpatient Medications:    diazepam  (VALIUM ) 5 MG tablet, Take 1-2 tablets (5-10 mg total) by mouth every 6 (six) hours as needed for muscle spasms., Disp: 30 tablet, Rfl: 0   HYDROcodone -acetaminophen  (NORCO) 10-325 MG tablet, Take 1-2 tablets by mouth every 4 (four) hours as needed for moderate pain ((score 4 to 6))., Disp: 50 tablet, Rfl: 0   oxyCODONE  (ROXICODONE ) 5 MG immediate release tablet, Take 1 tablet (5 mg total) by mouth every 4 (four) hours as needed for severe pain (pain score 7-10)., Disp: 15 tablet, Rfl: 0

## 2024-03-23 NOTE — Progress Notes (Addendum)
 " Radiation Oncology         [phone] ________________________________  Initial outpatient Consultation  Name: Ralph Franklin Dec Sr. MRN: [id number]  Date: 03/24/2024  DOB: Jul 24, 1950  CC:Sun, Talitha, MD  Autumn Millman, MD   REFERRING PHYSICIAN: Autumn Millman, MD  DIAGNOSIS: C09.0   ICD-10-CM   1. Mass of right side of neck  R22.1     2. Malignant neoplasm of tonsillar fossa (HCC)  C09.0        Right tonsillar pillar primary pharyngeal neoplasm    Cancer Staging  Malignant neoplasm of tonsillar fossa (HCC) Staging form: Pharynx - HPV-Mediated Oropharynx, AJCC 8th Edition - Clinical stage from 03/24/2024: Stage I (cT2, cN1, cM0, p16+) - Signed by Izell Domino, MD on 03/24/2024 Stage prefix: Initial diagnosis   CHIEF COMPLAINT: Here to discuss management of tonsillar cancer  HISTORY OF PRESENT ILLNESS::Ralph Franklin. is a 73 y.o. male who presented to his PCP in early November with complains of neck mass he noticed when shaving.  To further investigate his symptoms, he underwent a US  soft tissue neck on 02/18/24 showing hypoechoic right submandibular mass, ovoid and well circumscribed with internal vascularity, measuring up to 3.4 x 2.0 x 3.0 cm, suspicious for a pathologic lymph node. He then underwent a CT soft tissue neck subsequently performed on 02/25/24 showing right neck marked area of clinical concern overlying a pathologically enlarged solid right level 2a lymph node which is 22 x 29 x 38 mm in certain dimensions. Smaller but asymmetrically enlarged additional right level II and right level II / level III junction lymph nodes are present. Results are consistent with right tonsillar pillar primary.    Subsequently, the patient was referred to Dr. Tobie on 03/11/24 to discuss further treatment plan. He underwent a Transnasal Fiberoptic Laryngoscopy  at that time showing right tonsil exophytic mass but does not involve the epiglottis or palate, seems quite confined to  tonsillar area. The visualized portion of the subglottis and proximal trachea is widely patent. At that time patient expressed his wishes to undergo excision/surgical removal, however, further steps will be determined after PET scans.   Punch Biopsy of right tonsillar lesion on 03/22/24 which revealed: moderately differentiated, squamous cell carcinoma, p16 and p40 positive. No perineural or lymphovascular invasion was identified.   Pertinent imaging thus far includes PET  performed on 03/19/24 revealing asymmetric increased uptake within the right tonsil region measuring approximately 2.4 cm with SUV max of 14.5, concerning for head and neck primary along with an enlarged and tracer avid right level 2 lymph node measuring 2.9 cm with SUVmax of 15.1, with no contralateral tracer avid lymph nodes.     Swallowing issues, if any: None   More details from discussion with the patient and his wife today: He has no cancer, smoking, chewing tobacco history or heavy alcohol use.   He has significant dental issues with three upper teeth broken at the gum line after a past accident.  He is aware of potential radiation side effects including sore throat, taste changes, and fatigue. 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    PAST SURGICAL HISTORY: Past Surgical History:  Procedure Laterality Date   INGUINAL HERNIA REPAIR Bilateral 12/01/2018   Procedure: LAPAROSCOPIC BILATERAL INGUINAL HERNIA REPAIR WITH MESH;  Surgeon: Gail Favorite, MD;  Location: [facility];  Service: General;  Laterality: Bilateral;    FAMILY HISTORY: family history is not on file.  SOCIAL HISTORY:  reports that he has never smoked. He has never used smokeless tobacco. He reports that he does not currently use alcohol after a past usage of about 1.0 standard drink of alcohol per week. He reports that  he does not use drugs.  ALLERGIES: Patient has no known allergies.  MEDICATIONS:  Current Outpatient Medications  Medication Sig Dispense Refill   oxyCODONE  (ROXICODONE ) 5 MG immediate release tablet Take 1 tablet (5 mg total) by mouth every 4 (four) hours as needed for severe pain (pain score 7-10). 15 tablet 0   diazepam  (VALIUM ) 5 MG tablet Take 1-2 tablets (5-10 mg total) by mouth every 6 (six) hours as needed for muscle spasms. (Patient not taking: Reported on 03/24/2024) 30 tablet 0   HYDROcodone -acetaminophen  (NORCO) 10-325 MG tablet Take 1-2 tablets by mouth every 4 (four) hours as needed for moderate pain ((score 4 to 6)). (Patient not taking: Reported on 03/24/2024) 50 tablet 0   No current facility-administered medications for this encounter.    REVIEW OF SYSTEMS:  Notable for that above.   PHYSICAL EXAM:  weight is 195 lb (88.5 kg). His temporal temperature is 98.1 F (36.7 C). His blood pressure is 129/98 (abnormal) and his pulse is 69. His respiration is 17 and oxygen saturation is 100%.   General: Alert and oriented, in no acute distress HEENT: Head is normocephalic. Extraocular movements are intact. Oropharynx is notable for fullness in the right tonsil. Neck: Neck is notable for mobile mass in level 2 region of the right neck.  No other palpable adenopathy in the cervical or supraclavicular regions Heart: Regular in rate and rhythm with no murmurs, rubs, or gallops. Chest: Clear to auscultation bilaterally, with no rhonchi, wheezes, or rales. Abdomen: Soft, nontender, nondistended, with no rigidity or guarding. Extremities: No cyanosis or edema. Lymphatics: see Neck Exam Skin: No concerning lesions. Musculoskeletal: symmetric strength and muscle tone throughout. Neurologic: Cranial nerves II through XII are grossly intact. No obvious focalities. Speech is fluent. Coordination is intact. Psychiatric: Judgment and insight are intact. Affect is appropriate.   ECOG =  0  0 - Asymptomatic (Fully active, able to carry on all predisease activities without restriction)  1 - Symptomatic but completely ambulatory (Restricted in physically strenuous activity but ambulatory and able to carry out work of a light or sedentary nature. For example, light housework, office work)  2 - Symptomatic, <50% in bed during the day (Ambulatory and capable of all self care but unable to carry out any work activities. Up and about more than 50% of waking hours)  3 - Symptomatic, >50% in bed, but not bedbound (Capable of only limited self-care, confined to bed or chair 50% or more of waking hours)  4 - Bedbound (Completely disabled. Cannot carry on any self-care. Totally confined to bed or chair)  5 - Death   Raylene MM, Creech RH, Tormey DC, et al. [phone]). Toxicity and response criteria of the [facility]. Am. DOROTHA Bridges. Oncol. 5 (6): 649-55   LABORATORY DATA:  Lab Results  Component Value Date   WBC 3.8 (L) 03/24/2024   HGB 15.3 03/24/2024   HCT 44.7 03/24/2024   MCV 90.9 03/24/2024   PLT 217 03/24/2024   CMP     Component Value Date/Time   NA 142 03/24/2024 1216   K 4.2 03/24/2024 1216   CL 105 03/24/2024 1216  CO2 28 03/24/2024 1216   GLUCOSE 99 03/24/2024 1216   BUN 18 03/24/2024 1216   CREATININE 1.28 (H) 03/24/2024 1216   CALCIUM 9.6 03/24/2024 1216   PROT 7.3 03/24/2024 1216   ALBUMIN 4.5 03/24/2024 1216   AST 17 03/24/2024 1216   ALT 13 03/24/2024 1216   ALKPHOS 56 03/24/2024 1216   BILITOT 0.7 03/24/2024 1216   GFRNONAA 59 (L) 03/24/2024 1216   GFRAA >60 11/24/2018 1508      Lab Results  Component Value Date   TSH 0.529 03/24/2024     RADIOGRAPHY: NM PET Image Initial (PI) Skull Base To Thigh Result Date: 03/20/2024 EXAM: PET AND CT SKULL BASE TO MID THIGH 03/19/2024 06:15:19 PM TECHNIQUE: RADIOPHARMACEUTICAL: 9.88 mCi F-18 FDG Uptake time 60 minutes. Glucose level 85 mg/dl. PET imaging was acquired from the base of the  skull to the mid thighs. Non-contrast enhanced computed tomography was obtained for attenuation correction and anatomic localization. COMPARISON: CT neck 02/25/2024. CLINICAL HISTORY: neck mass, head and neck primary suspected FINDINGS: HEAD AND NECK: Asymmetric increased uptake within the right tonsillar region measures approximately 2.4 cm with SUV max of 14.5, axial image 27. Enlarged and tracer avid right level 2 lymph node measures 2.9 cm with SUV max of 15.1, axial image 31. No contralateral tracer avid lymph nodes. CHEST: Calcified left hilar and left mediastinal lymph nodes identified, which are not tracer avid. No tracer avid lymph nodes within the chest. No tracer avid pulmonary nodule or mass. ABDOMEN AND PELVIS: No abnormal uptake identified within the liver, pancreas, spleen, or adrenal glands. No tracer avid abdominal or pelvic lymph nodes. Prostate gland enlargement with mild heterogeneous uptake including a small focus of increased uptake near the base with SUV max of 3.7. No metabolically active intraperitoneal mass. Physiologic activity within the gastrointestinal and genitourinary systems. BONES AND SOFT TISSUE: No tracer uptake bone lesions are identified. Postoperative changes are identified at L4-L5 compatible with prior laminectomy and posterior hardware fixation. No metabolically active aggressive osseous lesion. IMPRESSION: 1. Asymmetric increased uptake within the right tonsil region measuring approximately 2.4 cm with SUV max of 14.5, concerning for head and neck primary. 2. Enlarged and tracer avid right level 2 lymph node measuring 2.9 cm with SUV max of 15.1, with no contralateral tracer avid lymph nodes. 3. No signs of distant metastatic disease. 4. Prostate gland enlargement with mild nonspecific heterogeneous uptake. Correlation with psa values advised. Electronically signed by: Waddell Calk MD 03/20/2024 06:49 AM EST RP Workstation: [id number]   CT SOFT TISSUE NECK W  CONTRAST Result Date: 03/04/2024 EXAM: CT NECK WITH CONTRAST 02/25/2024 02:23:55 PM TECHNIQUE: CT of the neck was performed with the administration of 75 mL of Isovue  300 intravenous contrast. Multiplanar reformatted images are provided for review. Automated exposure control, iterative reconstruction, and/or weight based adjustment of the mA/kV was utilized to reduce the radiation dose to as low as reasonably achievable. COMPARISON: Neck ultrasound 02/18/2024. CLINICAL HISTORY: 73 year old male knot noticeable to RT side neck under jaw area x 3 weeks Abnormal Ultrasound recently. FINDINGS: AERODIGESTIVE TRACT: No laryngeal mass or hyperenhancement. No significant pharyngeal hyperenhancement, but there is asymmetric and masslike soft tissue of the right tonsillar pillar best demonstrated on coronal image 66, some margins are indistinct, but the lesion is approximately 15 x 15 x 22 mm. Other pharyngeal soft tissue contours are within normal limits. The parapharyngeal and retropharyngeal spaces are negative (note a retropharyngeal course of both internal carotid arteries on series 3 image  40, normal variant). SALIVARY GLANDS: The parotid and submandibular glands are unremarkable. THYROID : Unremarkable. LYMPH NODES: Right neck marked area of clinical concern on series 3 image 47 is overlying a pathologically enlarged solid right level 2a lymph node which is 22 x 29 x 38 mm in certain dimensions. Smaller but asymmetrically enlarged additional right level II and right level II / level III junction lymph nodes are present (sagittal image 21). No cystic or necrotic lymph nodes. No contralateral lymphadenopathy. Right level 1, level 4 and level 5 lymph nodes remain within normal limits. BRAIN, ORBITS, SINUSES AND MASTOIDS: Visible paranasal sinuses, tympanic cavities and mastoids are well aerated. No acute abnormality. LUNGS AND MEDIASTINUM: Calcified atherosclerosis of the aortic arch. Otherwise negative visible superior  mediastinum. Negative visible lung apices. BONES: Scattered bilateral dental caries. Chronic cervical spine degeneration. No acute or suspicious osseous lesion. VASCULATURE: Major vascular structures in the bilateral neck and chest the skull bases are enhancing and patent. Both cervical internal carotid arteries are tortuous and have a partially retropharyngeal course. Right vertebral artery appears dominant, normal variant. No significant atherosclerosis. Calcified atherosclerosis of the aortic arch. IMPRESSION: 1. 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 PET scan confirmed right tonsil mass and lymph node involvement. Favorable prognosis due to non-smoking status.   - Consulting w Dr. Tobie regarding surgical options.  He may be a candidate for TORS.  I explained to the patient that if he undergoes surgery he will very likely need radiation therapy to minimize the risk of recurrence adjuvantly.  The patient understands that if surgery is performed there is still a chance that he may need adjuvant chemotherapy if he has risk factors such as extracapsular extension.  The goal is to try to avoid trimodality therapy and all the side effects that come with  this. - Consider concurrent chemo radiation therapy as another curative option.  I did acknowledge that definitive radiation without chemotherapy is also a curative option, not quite as aggressive as surgery followed by radiation or concurrent chemoradiation - Schedule mask fitting for radiation therapy post Dr. Nila dentistry clearance and surgical clearance. - Monitor for radiation therapy side effects: sore throat, phlegm, taste changes, skin irritation. - Provide supportive care: nutritionist, swallowing therapist, physical therapist, social worker  Dental disease complicating cancer therapy Broken teeth at gum line may increase cavity risk due to radiation-induced dry mouth.  Dental consultation recommended to assess dental work need and scatter protection device use to reduce radiation reflection from metal fillings. - Referred to Dr. Danial for dental evaluation and radiation therapy clearance. - Discuss potential dental work and production manager device use with Dr. Danial. - Today I created and sent a personalized  document predicting distribution of radiation dose to dentistry  Management of radiation therapy and sequelae Radiation therapy is key to treatment. Potential side  effects: sore throat, phlegm, taste changes, skin irritation, fatigue. Long-term effects: dry mouth, thyroid  sluggishness, vessel calcifications. Supportive care essential for side effect management and quality of life maintenance. - Initiate radiation therapy post surgical consultation and dental clearance. - Provide supportive care: nutritionist, swallowing therapist, physical therapist. - Monitor for side effects and adjust treatment as necessary.   We discussed the potential risks, benefits, and side effects of radiotherapy. We talked in detail about acute and late effects. We discussed that some of the most bothersome acute effects may be mucositis, dysgeusia, salivary changes, skin irritation, hair  loss, dehydration, weight loss and fatigue. We talked about late effects which include but are not necessarily limited to dysphagia, hypothyroidism, nerve injury, vascular injury, spinal cord injury, xerostomia, trismus, neck edema, dental issues, non-healing wound, and potentially fatal injury to any of the tissues in the head and neck region. No guarantees of treatment were given. A consent form was signed and placed in the patient's medical record. The patient is enthusiastic about proceeding with treatment. I look forward to participating in the patient's care.    The patient understands that there was some nonspecific activity in his prostate seen on his PET scan. Dr Autumn and I personally discussed this today. PSA recommended for clinical correlation.  Dr. Autumn of medical oncology   states that he can order this.  Alternatively the patient can have this performed with his PCP.   On date of service, in total, I spent 90 minutes on this encounter. Patient was seen in person.  __________________________________________   Lauraine Golden, MD  This document serves as a record of services personally performed by Lauraine Golden, MD. It was created on her behalf by Reymundo Cartwright, a trained medical scribe. The creation of this record is based on the scribe's personal observations and the provider's statements to them. This document has been checked and approved by the attending provider.  "

## 2024-03-24 ENCOUNTER — Ambulatory Visit
Admission: RE | Admit: 2024-03-24 | Discharge: 2024-03-24 | Disposition: A | Discharge status: Home / Self Care | Source: Ambulatory Visit | Attending: Radiation Oncology | Admitting: Radiation Oncology

## 2024-03-24 ENCOUNTER — Inpatient Hospital Stay: Admitting: Oncology

## 2024-03-24 ENCOUNTER — Encounter: Payer: Self-pay | Admitting: Radiation Oncology

## 2024-03-24 ENCOUNTER — Encounter: Payer: Self-pay | Admitting: Oncology

## 2024-03-24 ENCOUNTER — Inpatient Hospital Stay

## 2024-03-24 VITALS — BP 129/98 | HR 69 | Temp 98.1°F | Resp 17 | Wt 195.0 lb

## 2024-03-24 VITALS — BP 112/87 | HR 68 | Temp 97.9°F | Resp 17 | Wt 195.6 lb

## 2024-03-24 DIAGNOSIS — C090 Malignant neoplasm of tonsillar fossa: Secondary | ICD-10-CM | POA: Insufficient documentation

## 2024-03-24 DIAGNOSIS — K449 Diaphragmatic hernia without obstruction or gangrene: Secondary | ICD-10-CM | POA: Diagnosis not present

## 2024-03-24 DIAGNOSIS — R9389 Abnormal findings on diagnostic imaging of other specified body structures: Secondary | ICD-10-CM

## 2024-03-24 DIAGNOSIS — N400 Enlarged prostate without lower urinary tract symptoms: Secondary | ICD-10-CM | POA: Diagnosis not present

## 2024-03-24 DIAGNOSIS — R221 Localized swelling, mass and lump, neck: Secondary | ICD-10-CM

## 2024-03-24 DIAGNOSIS — Z79899 Other long term (current) drug therapy: Secondary | ICD-10-CM | POA: Insufficient documentation

## 2024-03-24 DIAGNOSIS — Z9185 Personal history of military service: Secondary | ICD-10-CM | POA: Insufficient documentation

## 2024-03-24 LAB — CBC WITH DIFFERENTIAL (CANCER CENTER ONLY)
Abs Immature Granulocytes: 0.01 K/uL (ref 0.00–0.07)
Basophils Absolute: 0.1 K/uL (ref 0.0–0.1)
Basophils Relative: 1 %
Eosinophils Absolute: 0 K/uL (ref 0.0–0.5)
Eosinophils Relative: 1 %
HCT: 44.7 % (ref 39.0–52.0)
Hemoglobin: 15.3 g/dL (ref 13.0–17.0)
Immature Granulocytes: 0 %
Lymphocytes Relative: 29 %
Lymphs Abs: 1.1 K/uL (ref 0.7–4.0)
MCH: 31.1 pg (ref 26.0–34.0)
MCHC: 34.2 g/dL (ref 30.0–36.0)
MCV: 90.9 fL (ref 80.0–100.0)
Monocytes Absolute: 0.5 K/uL (ref 0.1–1.0)
Monocytes Relative: 13 %
Neutro Abs: 2.1 K/uL (ref 1.7–7.7)
Neutrophils Relative %: 56 %
Platelet Count: 217 K/uL (ref 150–400)
RBC: 4.92 MIL/uL (ref 4.22–5.81)
RDW: 13.7 % (ref 11.5–15.5)
WBC Count: 3.8 K/uL — ABNORMAL LOW (ref 4.0–10.5)
nRBC: 0 % (ref 0.0–0.2)

## 2024-03-24 LAB — CMP (CANCER CENTER ONLY)
ALT: 13 U/L (ref 0–44)
AST: 17 U/L (ref 15–41)
Albumin: 4.5 g/dL (ref 3.5–5.0)
Alkaline Phosphatase: 56 U/L (ref 38–126)
Anion gap: 9 (ref 5–15)
BUN: 18 mg/dL (ref 8–23)
CO2: 28 mmol/L (ref 22–32)
Calcium: 9.6 mg/dL (ref 8.9–10.3)
Chloride: 105 mmol/L (ref 98–111)
Creatinine: 1.28 mg/dL — ABNORMAL HIGH (ref 0.61–1.24)
GFR, Estimated: 59 mL/min — ABNORMAL LOW
Glucose, Bld: 99 mg/dL (ref 70–99)
Potassium: 4.2 mmol/L (ref 3.5–5.1)
Sodium: 142 mmol/L (ref 135–145)
Total Bilirubin: 0.7 mg/dL (ref 0.0–1.2)
Total Protein: 7.3 g/dL (ref 6.5–8.1)

## 2024-03-24 LAB — PSA: Prostatic Specific Antigen: 1.62 ng/mL (ref 0.00–4.00)

## 2024-03-24 LAB — TSH: TSH: 0.529 u[IU]/mL (ref 0.350–4.500)

## 2024-03-24 NOTE — Progress Notes (Signed)
 Oncology Nurse Navigator Documentation   Met with patient during initial consult with Dr. Izell and Dr. Autumn.  He was accompanied by his wife, Meade.  I introduced myself as his/their Navigator, explained my role as a member of the Care Team. Provided New Patient resource guide binder: Contact information for physicians, this navigator, other members of the Care Team Advance Directive information; provided [facility] AD booklet at their request,  Fall Prevention Patient Safety Plan Financial Assistance Information sheet Symptom Management Clinic information [facility] map with highlight of [facility] SLP Information sheet Head and Neck cancer basics Nutrition information Patient and family support information including Spiritual care/Chaplain information, Peer mentor program, health and wellness classes, and the survivorship program Community resources  Provided and discussed educational handouts for PEG and PAC. Assisted with post-consult appt scheduling. He is aware that I will be referring him to Dr. Danial at [facility] dental for dental clearance and possible scatter protective device fabrication.  They verbalized understanding of information provided. I encouraged them to call with questions/concerns moving forward.  [name], RN, BSN, OCN Head & Neck Oncology Nurse Navigator [facility] at [place] [phone]

## 2024-03-24 NOTE — Progress Notes (Signed)
 "  [facility]  ONCOLOGY CLINIC PROGRESS NOTE   Patient Care Team: Austin Mutton, MD as PCP - General (Internal Medicine)  PATIENT NAME: Ralph PRINTUP Sr.   MR#: [id number] DOB: Apr 15, 1950  Date of visit: 03/24/2024   ASSESSMENT & PLAN:   Ralph VINAL Sr. is a 73 y.o. 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  Malignant neoplasm of tonsillar fossa (HCC) Please review oncology history for additional details and timeline of events.  Biopsy-confirmed right tonsillar squamous cell carcinoma with a 2.9 cm right cervical lymph node metastasis, localized to the right side without contralateral lymphadenopathy or distant metastasis. Lesion appears slightly larger on clinical examination than imaging. Ralph Franklin is asymptomatic, without pain or dysphagia.   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  Dr. Tobie referred him to head and neck surgeons at [facility] for consideration of TORS.  If patient were to proceed with surgery, most likely Ralph Franklin will need adjuvant radiation therapy to minimize risk of recurrence.  If patient decides not to proceed with surgery, we can still aim for curative intent treatment with concurrent chemoradiation.  Patient is awaiting appointment with the head and neck surgeons before making further decisions as to which way Ralph Franklin would like to proceed.  Ralph Franklin is considering input from multiple providers prior to making a treatment decision.  - Case will be discussed at multidisciplinary tumor conference to finalize treatment approach.  - Reviewed non-surgical curative option with concurrent cisplatin-based chemoradiation, including administration details, duration, and side effects (mucositis, need for feeding tube,  nephrotoxicity, ototoxicity, cytopenias, electrolyte disturbances, fatigue).  - Discussed port placement for chemotherapy administration if chemoradiation is selected.  - Discussed prophylactic feeding tube placement due to anticipated mucositis and dysphagia during chemoradiation.  - Ordered baseline laboratory studies, including complete blood count and thyroid  function, prior to initiation of radiation therapy.  Our nurse navigator will coordinate appointments, depending on patient's preferences after Ralph Franklin meets with the head and neck surgeons    Benign prostatic hyperplasia Benign prostatic hyperplasia with mildly enlarged prostate and mild PET uptake, consistent with age. Ralph Franklin is asymptomatic without lower urinary tract symptoms. - Ordered prostate-specific antigen (PSA) to further evaluate prostate status.   I reviewed lab results and outside records for this visit and discussed relevant results with the patient. Diagnosis, plan of care and treatment options were also discussed in detail with the patient. Opportunity provided to ask questions and answers provided to his apparent satisfaction. Provided instructions to call our clinic with any problems, questions or concerns prior to return visit. I recommended to continue follow-up with PCP and sub-specialists. Ralph Franklin verbalized understanding and agreed with the plan.   NCCN guidelines have been consulted in the planning of this patients care.  I spent a total of 55 minutes during this encounter with the patient including review of chart and various tests results, discussions about plan of care and coordination of care plan.   Chinita Patten, MD  03/24/2024 4:00 PM  [facility] - [facility] DEPT [facility] [address] [place] [address] Dept: [phone] Dept Fax: [phone]    CHIEF COMPLAINT/ REASON FOR VISIT:   Squamous cell, of the right tonsil, p16 positive,  with metastatic disease to ipsilateral lymph node  Current  Treatment: Patient considering TORS versus proceeding with concurrent chemoradiation  INTERVAL HISTORY:    Discussed the use of AI scribe software for clinical note transcription with the patient, who gave verbal consent to proceed.  History of Present Illness Ralph Marques. is a 73 year old male with newly diagnosed right tonsillar squamous cell carcinoma with right cervical lymph node metastasis who presents for oncology follow-up and discussion of treatment options.  Ralph Franklin was last seen two weeks ago and has since undergone biopsy and PET imaging, which confirmed a right tonsillar lesion measuring approximately 1 inch and a 2.9 cm right cervical lymph node metastasis. No contralateral lymphadenopathy or distant metastatic disease was identified.  Ralph Franklin has not yet received any treatment for this malignancy and is currently considering input from multiple providers regarding management. Ralph Franklin is deliberating between surgical resection (tonsillectomy and neck dissection) followed by possible adjuvant radiation, versus definitive concurrent chemoradiation with cisplatin.  Ralph Franklin denies pain, dysphagia, odynophagia, and has no difficulty swallowing.  Ralph Franklin has no urinary symptoms, including dysuria or changes in urinary flow, despite PET findings of a mildly enlarged prostate. Ralph Franklin is unsure if a PSA has been checked recently.     I have reviewed the past medical history, past surgical history, social history and family history with the patient and they are unchanged from previous note.  HISTORY OF PRESENT ILLNESS:   ONCOLOGY HISTORY:   Ralph Franklin initially presented to his PCP in early November 2025 with complaints of neck mass that Ralph Franklin noticed when Ralph Franklin was shaving.  Dr. Austin obtained US  soft tissue neck on 02/18/24 which showed hypoechoic right submandibular mass, ovoid and well circumscribed with internal vascularity, measuring up to 3.4 x 2.0 x 3.0  cm, suspicious for a pathologic lymph node.   CT soft tissue neck subsequently performed on 02/25/24 showing right neck marked area of clinical concern overlying a pathologically enlarged solid right level 2a lymph node which is 22 x 29 x 38 mm in certain dimensions. Smaller but asymmetrically enlarged additional right level II and right level II / level III junction lymph nodes are present. 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   Subsequently patient was referred to Dr. Tobie and to [facility] clinic.    On 03/11/2024, Ralph Franklin underwent Transnasal Fiberoptic Laryngoscopy showing right tonsil exophytic mass but does not involve the epiglottis or palate, seems quite confined to tonsillar area. The visualized portion of the subglottis and proximal trachea is widely patent. At that time patient expressed his wishes to undergo excision/surgical removal, however, further steps were to be determined after PET scan.    Punch Biopsy of right tonsillar lesion on 03/22/24 which revealed: moderately differentiated, squamous cell carcinoma, p16 and p40 positive. No perineural or lymphovascular invasion was identified.    Staging PET scan performed on 03/19/24 revealing asymmetric increased uptake within the right tonsil region measuring approximately 2.4 cm with SUV max of 14.5, concerning for head and neck primary along with an enlarged and tracer avid right level 2 lymph node measuring 2.9 cm with SUVmax of 15.1, with no contralateral tracer avid lymph nodes.     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  Dr. Tobie referred him to head and neck surgeons at [facility] for consideration of TORS.  If patient were to proceed with surgery, most likely Ralph Franklin will need adjuvant radiation therapy to minimize risk of recurrence.  If patient decides not to proceed with surgery, we can still aim  for curative intent treatment with concurrent  chemoradiation.  Patient is awaiting appointment with the head and neck surgeons before making further decisions as to which way Ralph Franklin would like to proceed.    Oncology History  Malignant neoplasm of tonsillar fossa (HCC)  03/24/2024 Initial Diagnosis   Malignant neoplasm of tonsillar fossa (HCC)   03/24/2024 Cancer Staging   Staging form: Pharynx - HPV-Mediated Oropharynx, AJCC 8th Edition - Clinical stage from 03/24/2024: Stage I (cT2, cN1, cM0, p16+) - Signed by Izell Domino, MD on 03/24/2024 Stage prefix: Initial diagnosis       REVIEW OF SYSTEMS:   Review of Systems - Oncology  All other pertinent systems were reviewed with the patient and are negative.  ALLERGIES: Ralph Franklin has no known allergies.  MEDICATIONS:  Current Outpatient Medications  Medication Sig Dispense Refill   oxyCODONE  (ROXICODONE ) 5 MG immediate release tablet Take 1 tablet (5 mg total) by mouth every 4 (four) hours as needed for severe pain (pain score 7-10). 15 tablet 0   diazepam  (VALIUM ) 5 MG tablet Take 1-2 tablets (5-10 mg total) by mouth every 6 (six) hours as needed for muscle spasms. (Patient not taking: Reported on 03/24/2024) 30 tablet 0   HYDROcodone -acetaminophen  (NORCO) 10-325 MG tablet Take 1-2 tablets by mouth every 4 (four) hours as needed for moderate pain ((score 4 to 6)). (Patient not taking: Reported on 03/24/2024) 50 tablet 0   No current facility-administered medications for this visit.     VITALS:   Blood pressure 112/87, pulse 68, temperature 97.9 F (36.6 C), resp. rate 17, weight 195 lb 9.6 oz (88.7 kg), SpO2 100%.  Wt Readings from Last 3 Encounters:  03/24/24 195 lb 9.6 oz (88.7 kg)  03/24/24 195 lb (88.5 kg)  03/22/24 198 lb (89.8 kg)    Body mass index is 27.28 kg/m.   Onc Performance Status - 03/24/24 1146       ECOG Perf Status   ECOG Perf Status Fully active, able to carry on all pre-disease performance without restriction      KPS SCALE   KPS % SCORE Normal, no  compliants, no evidence of disease          PHYSICAL EXAM:   Physical Exam Constitutional:      General: Ralph Franklin is not in acute distress.    Appearance: Normal appearance.  HENT:     Head: Normocephalic and atraumatic.     Mouth/Throat:     Comments: Fullness noted in the right tonsillar area without definite mass Eyes:     Conjunctiva/sclera: Conjunctivae normal.  Cardiovascular:     Rate and Rhythm: Normal rate and regular rhythm.  Pulmonary:     Effort: Pulmonary effort is normal. No respiratory distress.  Abdominal:     General: There is no distension.  Lymphadenopathy:     Cervical: Cervical adenopathy (~ 3 cm mobile lymph node in right level 2 area) present.  Neurological:     General: No focal deficit present.     Mental Status: Ralph Franklin is alert and oriented to person, place, and time.  Psychiatric:        Mood and Affect: Mood normal.        Behavior: Behavior normal.      LABORATORY DATA:   I have reviewed the data as listed.  Results for orders placed or performed in visit on 03/24/24  TSH  Result Value Ref Range   TSH 0.529 0.350 - 4.500 uIU/mL  CMP (Cancer Center only)  Result  Value Ref Range   Sodium 142 135 - 145 mmol/L   Potassium 4.2 3.5 - 5.1 mmol/L   Chloride 105 98 - 111 mmol/L   CO2 28 22 - 32 mmol/L   Glucose, Bld 99 70 - 99 mg/dL   BUN 18 8 - 23 mg/dL   Creatinine 8.71 (H) 9.38 - 1.24 mg/dL   Calcium 9.6 8.9 - 89.6 mg/dL   Total Protein 7.3 6.5 - 8.1 g/dL   Albumin 4.5 3.5 - 5.0 g/dL   AST 17 15 - 41 U/L   ALT 13 0 - 44 U/L   Alkaline Phosphatase 56 38 - 126 U/L   Total Bilirubin 0.7 0.0 - 1.2 mg/dL   GFR, Estimated 59 (L) >60 mL/min   Anion gap 9 5 - 15  CBC with Differential (Cancer Center Only)  Result Value Ref Range   WBC Count 3.8 (L) 4.0 - 10.5 K/uL   RBC 4.92 4.22 - 5.81 MIL/uL   Hemoglobin 15.3 13.0 - 17.0 g/dL   HCT 55.2 60.9 - 47.9 %   MCV 90.9 80.0 - 100.0 fL   MCH 31.1 26.0 - 34.0 pg   MCHC 34.2 30.0 - 36.0 g/dL   RDW  86.2 88.4 - 84.4 %   Platelet Count 217 150 - 400 K/uL   nRBC 0.0 0.0 - 0.2 %   Neutrophils Relative % 56 %   Neutro Abs 2.1 1.7 - 7.7 K/uL   Lymphocytes Relative 29 %   Lymphs Abs 1.1 0.7 - 4.0 K/uL   Monocytes Relative 13 %   Monocytes Absolute 0.5 0.1 - 1.0 K/uL   Eosinophils Relative 1 %   Eosinophils Absolute 0.0 0.0 - 0.5 K/uL   Basophils Relative 1 %   Basophils Absolute 0.1 0.0 - 0.1 K/uL   Immature Granulocytes 0 %   Abs Immature Granulocytes 0.01 0.00 - 0.07 K/uL      RADIOGRAPHIC STUDIES:  I have personally reviewed the radiological images as listed and agree with the findings in the report.  NM PET Image Initial (PI) Skull Base To Thigh EXAM: PET AND CT SKULL BASE TO MID THIGH 03/19/2024 06:15:19 PM  TECHNIQUE:  RADIOPHARMACEUTICAL: 9.88 mCi F-18 FDG Uptake time 60 minutes. Glucose level 85 mg/dl.  PET imaging was acquired from the base of the skull to the mid thighs. Non-contrast enhanced computed tomography was obtained for attenuation correction and anatomic localization.  COMPARISON: CT neck 02/25/2024.  CLINICAL HISTORY: neck mass, head and neck primary suspected  FINDINGS:  HEAD AND NECK: Asymmetric increased uptake within the right tonsillar region measures approximately 2.4 cm with SUV max of 14.5, axial image 27.  Enlarged and tracer avid right level 2 lymph node measures 2.9 cm with SUV max of 15.1, axial image 31. No contralateral tracer avid lymph nodes.  CHEST: Calcified left hilar and left mediastinal lymph nodes identified, which are not tracer avid. No tracer avid lymph nodes within the chest. No tracer avid pulmonary nodule or mass.  ABDOMEN AND PELVIS: No abnormal uptake identified within the liver, pancreas, spleen, or adrenal glands. No tracer avid abdominal or pelvic lymph nodes. Prostate gland enlargement with mild heterogeneous uptake including a small focus of increased uptake near the base with SUV max of 3.7. No  metabolically active intraperitoneal mass. Physiologic activity within the gastrointestinal and genitourinary systems.  BONES AND SOFT TISSUE: No tracer uptake bone lesions are identified. Postoperative changes are identified at L4-L5 compatible with prior laminectomy and posterior hardware fixation. No  metabolically active aggressive osseous lesion.  IMPRESSION: 1. Asymmetric increased uptake within the right tonsil region measuring approximately 2.4 cm with SUV max of 14.5, concerning for head and neck primary. 2. Enlarged and tracer avid right level 2 lymph node measuring 2.9 cm with SUV max of 15.1, with no contralateral tracer avid lymph nodes. 3. No signs of distant metastatic disease. 4. Prostate gland enlargement with mild nonspecific heterogeneous uptake. Correlation with psa values advised.  Electronically signed by: Waddell Calk MD 03/20/2024 06:49 AM EST RP Workstation: [id number]    CODE STATUS:  Code Status History     Date Active Date Inactive Code Status Order ID Comments User Context   01/25/2019 1523 01/26/2019 1644 Full Code [id number]  Louis Shove, MD Inpatient       Orders Placed This Encounter  Procedures   CBC with Differential (Cancer Center Only)    Standing Status:   Future    Number of Occurrences:   1    Expiration Date:   03/24/2025   CMP (Cancer Center only)    Standing Status:   Future    Number of Occurrences:   1    Expiration Date:   03/24/2025   TSH    Standing Status:   Future    Number of Occurrences:   1    Expiration Date:   03/24/2025   PSA (For [facility])    Standing Status:   Future    Number of Occurrences:   1    Expiration Date:   03/24/2025     No future appointments.     This document was completed utilizing speech recognition software. Grammatical errors, random word insertions, pronoun errors, and incomplete sentences are an occasional consequence of this system due to software limitations, ambient noise, and  hardware issues. Any formal questions or concerns about the content, text or information contained within the body of this dictation should be directly addressed to the provider for clarification.   "

## 2024-03-24 NOTE — Progress Notes (Signed)
 Dental Form with Estimates of Radiation Dose  Helayne FORBES Dec Sr. Date of birth: Apr 15, 1950          Diagnosis:    ICD-10-CM   1. Mass of right side of neck  R22.1     2. Malignant neoplasm of tonsillar fossa (HCC)  C09.0       Cancer Staging  Malignant neoplasm of tonsillar fossa (HCC) Staging form: Pharynx - HPV-Mediated Oropharynx, AJCC 8th Edition - Clinical stage from 03/24/2024: Stage I (cT2, cN1, cM0, p16+) - Signed by Izell Domino, MD on 03/24/2024 Stage prefix: Initial diagnosis   Prognosis: curative  Anticipated # of fractions: 30-35    Daily?: yes  # of weeks of radiotherapy: 6-7  Chemotherapy?: TBD (may have surgery followed by radiation alone or definitive radiation)  Anticipated xerostomia:  Mild permanent   Pre-simulation needs:    Scatter protection / Other -evaluation of broken teeth, dental work as needed, Corporate Investment [employer]er: ASAP    Other Notes:   Please contact Domino Izell, MD, with patient's disposition after evaluation and/or dental treatment. -----------------------------------  Domino Izell, MD

## 2024-03-24 NOTE — Progress Notes (Signed)
 Head and Neck Cancer Location of Tumor / Histology:  Right Neck Mass  Patient presented months ago with symptoms of:  Patient reports incidentally he noted a right neck mass while he was shaving. Denies any pain.  Biopsies revealed:    03/19/2024 PET Scan  02/25/2024 CT Neck W Contrast     Nutrition Status Yes No Comments  Weight changes? []  [x]    Swallowing concerns? []  [x]    PEG? []  [x]     Referrals Yes No Comments  Social Work? [x]  []    Dentistry? [x]  []    Swallowing therapy? [x]  []    Nutrition? [x]  []    Med/Onc? [x]  []     Safety Issues Yes No Comments  Prior radiation? []  [x]    Pacemaker/ICD? []  [x]    Possible current pregnancy? []  [x]    Is the patient on methotrexate? []  [x]     Tobacco/Marijuana/Snuff/ETOH use: None  Past/Anticipated interventions by otolaryngology, if any:  03/11/2024 Tobie, MD    Past/Anticipated interventions by medical oncology, if any:  None    Current Complaints / other details:   None

## 2024-03-24 NOTE — Assessment & Plan Note (Signed)
 Please review oncology history for additional details and timeline of events.  Biopsy-confirmed right tonsillar squamous cell carcinoma with a 2.9 cm right cervical lymph node metastasis, localized to the right side without contralateral lymphadenopathy or distant metastasis. Lesion appears slightly larger on clinical examination than imaging. He is asymptomatic, without pain or dysphagia.   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  Dr. Tobie referred him to head and neck surgeons at [facility] for consideration of TORS.  If patient were to proceed with surgery, most likely he will need adjuvant radiation therapy to minimize risk of recurrence.  If patient decides not to proceed with surgery, we can still aim for curative intent treatment with concurrent chemoradiation.  Patient is awaiting appointment with the head and neck surgeons before making further decisions as to which way he would like to proceed.  He is considering input from multiple providers prior to making a treatment decision.  - Case will be discussed at multidisciplinary tumor conference to finalize treatment approach.  - Reviewed non-surgical curative option with concurrent cisplatin-based chemoradiation, including administration details, duration, and side effects (mucositis, need for feeding tube, nephrotoxicity, ototoxicity, cytopenias, electrolyte disturbances, fatigue).  - Discussed port placement for chemotherapy administration if chemoradiation is selected.  - Discussed prophylactic feeding tube placement due to anticipated mucositis and dysphagia during chemoradiation.  - Ordered baseline laboratory studies, including complete blood count and thyroid  function, prior to initiation of radiation therapy.  Our nurse navigator will coordinate appointments, depending on patient's preferences after he meets with the head and neck surgeons

## 2024-04-19 ENCOUNTER — Telehealth (INDEPENDENT_AMBULATORY_CARE_PROVIDER_SITE_OTHER): Payer: Self-pay | Admitting: Otolaryngology

## 2024-04-19 NOTE — Telephone Encounter (Signed)
 Attempted to call patient, left VM Ralph Franklin

## 2024-04-21 NOTE — Progress Notes (Signed)
 Oncology Nurse Navigator Documentation   I left a VM with Ralph Franklin asking for a return call regarding getting him scheduled for his upcoming radiation planning, radiation, and chemotherapy appointments. He returned my call and informed me that he has decided not to receive treatment for his right tonsil cancer against medical advice. He tells me that he has prayed about what he should do and God has cursed the cancer away and he believes that he has been cured. I did tell him that the Drs recommend radiation and chemotherapy to cure his cancer and if he doesn't receive treatment it's likely that his cancer will grow and may eventually end his life. He acknowledged me but again let me know that he has trust in God and believes he has been cured. I have informed Dr. Tobie, Dr. Autumn, and Dr. Izell of his decision. He does have my direct contact information and knows to call me if he decides he would like treatment.   [name] RN, BSN, OCN Head & Neck Oncology Nurse Navigator [facility] Phone # [phone]  Fax # [phone]
